# Patient Record
Sex: Male | Born: 1965
Health system: Southern US, Community
[De-identification: ages and names within clinical notes are randomized; demographics above are authoritative.]

## PROBLEM LIST (undated history)

## (undated) DIAGNOSIS — G4733 Obstructive sleep apnea (adult) (pediatric): Secondary | ICD-10-CM

## (undated) DIAGNOSIS — E7800 Pure hypercholesterolemia, unspecified: Secondary | ICD-10-CM

## (undated) DIAGNOSIS — E669 Obesity, unspecified: Secondary | ICD-10-CM

## (undated) DIAGNOSIS — Z8639 Personal history of other endocrine, nutritional and metabolic disease: Secondary | ICD-10-CM

## (undated) DIAGNOSIS — F419 Anxiety disorder, unspecified: Secondary | ICD-10-CM

## (undated) DIAGNOSIS — E349 Endocrine disorder, unspecified: Secondary | ICD-10-CM

## (undated) DIAGNOSIS — Z8669 Personal history of other diseases of the nervous system and sense organs: Secondary | ICD-10-CM

## (undated) DIAGNOSIS — G510 Bell's palsy: Secondary | ICD-10-CM

## (undated) DIAGNOSIS — R51 Headache: Secondary | ICD-10-CM

## (undated) DIAGNOSIS — B009 Herpesviral infection, unspecified: Secondary | ICD-10-CM

## (undated) DIAGNOSIS — G2581 Restless legs syndrome: Secondary | ICD-10-CM

## (undated) HISTORY — DX: Anxiety disorder, unspecified: F41.9

## (undated) HISTORY — PX: OTHER SURGICAL HISTORY: SHX169

## (undated) HISTORY — DX: Personal history of other endocrine, nutritional and metabolic disease: Z86.39

## (undated) HISTORY — DX: Bell's palsy: G51.0

## (undated) HISTORY — DX: Headache: R51

## (undated) HISTORY — DX: Obstructive sleep apnea (adult) (pediatric): G47.33

## (undated) HISTORY — DX: Pure hypercholesterolemia, unspecified: E78.00

## (undated) HISTORY — DX: Herpesviral infection, unspecified: B00.9

## (undated) HISTORY — DX: Personal history of other diseases of the nervous system and sense organs: Z86.69

## (undated) HISTORY — DX: Restless legs syndrome: G25.81

## (undated) HISTORY — DX: Obesity, unspecified: E66.9

## (undated) HISTORY — DX: Endocrine disorder, unspecified: E34.9

---

## 2004-02-03 ENCOUNTER — Ambulatory Visit: Payer: Self-pay | Admitting: Pulmonary Disease

## 2004-02-08 ENCOUNTER — Emergency Department (HOSPITAL_COMMUNITY): Admission: EM | Admit: 2004-02-08 | Discharge: 2004-02-08 | Payer: Self-pay | Admitting: Emergency Medicine

## 2004-03-04 ENCOUNTER — Ambulatory Visit: Payer: Self-pay | Admitting: Pulmonary Disease

## 2004-07-01 ENCOUNTER — Ambulatory Visit: Payer: Self-pay | Admitting: Pulmonary Disease

## 2004-07-20 ENCOUNTER — Ambulatory Visit: Payer: Self-pay

## 2004-08-19 ENCOUNTER — Ambulatory Visit (HOSPITAL_BASED_OUTPATIENT_CLINIC_OR_DEPARTMENT_OTHER): Admission: RE | Admit: 2004-08-19 | Discharge: 2004-08-19 | Payer: Self-pay | Admitting: Pulmonary Disease

## 2004-08-31 ENCOUNTER — Ambulatory Visit: Payer: Self-pay | Admitting: Pulmonary Disease

## 2005-02-10 ENCOUNTER — Ambulatory Visit: Payer: Self-pay | Admitting: Pulmonary Disease

## 2005-02-25 ENCOUNTER — Ambulatory Visit: Payer: Self-pay | Admitting: Pulmonary Disease

## 2005-03-01 ENCOUNTER — Ambulatory Visit: Payer: Self-pay | Admitting: Pulmonary Disease

## 2006-03-02 ENCOUNTER — Ambulatory Visit: Payer: Self-pay | Admitting: Pulmonary Disease

## 2006-03-02 LAB — CONVERTED CEMR LAB
Basophils Absolute: 0 10*3/uL (ref 0.0–0.1)
Basophils Relative: 0.6 % (ref 0.0–1.0)
Bilirubin, Direct: 0.1 mg/dL (ref 0.0–0.3)
Chloride: 106 meq/L (ref 96–112)
Creatinine, Ser: 0.8 mg/dL (ref 0.4–1.5)
Direct LDL: 157.8 mg/dL
GFR calc Af Amer: 139 mL/min
GFR calc non Af Amer: 115 mL/min
Glucose, Bld: 101 mg/dL — ABNORMAL HIGH (ref 70–99)
HCT: 39.3 % (ref 39.0–52.0)
Hemoglobin: 13.4 g/dL (ref 13.0–17.0)
MCHC: 34.1 g/dL (ref 30.0–36.0)
Monocytes Absolute: 0.5 10*3/uL (ref 0.2–0.7)
Neutrophils Relative %: 61.9 % (ref 43.0–77.0)
Potassium: 4 meq/L (ref 3.5–5.1)
RBC: 4.39 M/uL (ref 4.22–5.81)
RDW: 12.8 % (ref 11.5–14.6)
Sodium: 140 meq/L (ref 135–145)
TSH: 1.05 microintl units/mL (ref 0.35–5.50)
Total CHOL/HDL Ratio: 5.5
Triglycerides: 58 mg/dL (ref 0–149)
VLDL: 12 mg/dL (ref 0–40)
WBC: 8.2 10*3/uL (ref 4.5–10.5)

## 2006-03-14 ENCOUNTER — Ambulatory Visit: Payer: Self-pay | Admitting: Pulmonary Disease

## 2006-03-14 LAB — CONVERTED CEMR LAB
Fecal Occult Blood: NEGATIVE
OCCULT 2: NEGATIVE
OCCULT 5: NEGATIVE

## 2006-05-17 ENCOUNTER — Ambulatory Visit: Payer: Self-pay | Admitting: Pulmonary Disease

## 2006-05-17 ENCOUNTER — Ambulatory Visit: Payer: Self-pay | Admitting: Cardiology

## 2006-05-20 ENCOUNTER — Ambulatory Visit: Payer: Self-pay | Admitting: Pulmonary Disease

## 2006-08-17 ENCOUNTER — Ambulatory Visit: Payer: Self-pay | Admitting: Pulmonary Disease

## 2006-09-02 ENCOUNTER — Ambulatory Visit: Payer: Self-pay | Admitting: Pulmonary Disease

## 2006-09-07 ENCOUNTER — Ambulatory Visit: Payer: Self-pay | Admitting: Pulmonary Disease

## 2006-09-23 ENCOUNTER — Ambulatory Visit: Payer: Self-pay | Admitting: Pulmonary Disease

## 2007-04-07 ENCOUNTER — Telehealth (INDEPENDENT_AMBULATORY_CARE_PROVIDER_SITE_OTHER): Payer: Self-pay | Admitting: *Deleted

## 2007-05-24 DIAGNOSIS — R51 Headache: Secondary | ICD-10-CM | POA: Insufficient documentation

## 2007-05-24 DIAGNOSIS — R519 Headache, unspecified: Secondary | ICD-10-CM | POA: Insufficient documentation

## 2007-05-24 DIAGNOSIS — Z9189 Other specified personal risk factors, not elsewhere classified: Secondary | ICD-10-CM | POA: Insufficient documentation

## 2007-05-24 DIAGNOSIS — E7800 Pure hypercholesterolemia, unspecified: Secondary | ICD-10-CM | POA: Insufficient documentation

## 2007-05-24 DIAGNOSIS — G2581 Restless legs syndrome: Secondary | ICD-10-CM | POA: Insufficient documentation

## 2007-05-24 DIAGNOSIS — F411 Generalized anxiety disorder: Secondary | ICD-10-CM | POA: Insufficient documentation

## 2007-05-24 DIAGNOSIS — K5900 Constipation, unspecified: Secondary | ICD-10-CM | POA: Insufficient documentation

## 2007-05-25 ENCOUNTER — Ambulatory Visit: Payer: Self-pay | Admitting: Pulmonary Disease

## 2007-05-25 DIAGNOSIS — G510 Bell's palsy: Secondary | ICD-10-CM | POA: Insufficient documentation

## 2007-05-25 DIAGNOSIS — B009 Herpesviral infection, unspecified: Secondary | ICD-10-CM | POA: Insufficient documentation

## 2007-05-25 DIAGNOSIS — G4733 Obstructive sleep apnea (adult) (pediatric): Secondary | ICD-10-CM | POA: Insufficient documentation

## 2007-05-25 DIAGNOSIS — J40 Bronchitis, not specified as acute or chronic: Secondary | ICD-10-CM | POA: Insufficient documentation

## 2007-05-25 HISTORY — DX: Bell's palsy: G51.0

## 2007-05-26 ENCOUNTER — Telehealth (INDEPENDENT_AMBULATORY_CARE_PROVIDER_SITE_OTHER): Payer: Self-pay | Admitting: *Deleted

## 2007-05-28 LAB — CONVERTED CEMR LAB
ALT: 15 units/L (ref 0–53)
AST: 25 units/L (ref 0–37)
Albumin: 4.4 g/dL (ref 3.5–5.2)
Alkaline Phosphatase: 55 units/L (ref 39–117)
BUN: 6 mg/dL (ref 6–23)
Basophils Absolute: 0 10*3/uL (ref 0.0–0.1)
Basophils Relative: 0.2 % (ref 0.0–1.0)
Bilirubin, Direct: 0.1 mg/dL (ref 0.0–0.3)
CO2: 28 meq/L (ref 19–32)
Calcium: 9.3 mg/dL (ref 8.4–10.5)
Chloride: 106 meq/L (ref 96–112)
Cholesterol: 141 mg/dL (ref 0–200)
Creatinine, Ser: 0.9 mg/dL (ref 0.4–1.5)
Eosinophils Absolute: 0.2 10*3/uL (ref 0.0–0.7)
Eosinophils Relative: 2.6 % (ref 0.0–5.0)
GFR calc Af Amer: 120 mL/min
GFR calc non Af Amer: 99 mL/min
Glucose, Bld: 106 mg/dL — ABNORMAL HIGH (ref 70–99)
HCT: 40.8 % (ref 39.0–52.0)
HDL: 33.4 mg/dL — ABNORMAL LOW (ref >39.0)
Hemoglobin: 13.7 g/dL (ref 13.0–17.0)
LDL Cholesterol: 101 mg/dL — ABNORMAL HIGH (ref 0–99)
Lymphocytes Relative: 24.8 % (ref 12.0–46.0)
MCHC: 33.5 g/dL (ref 30.0–36.0)
MCV: 90.2 fL (ref 78.0–100.0)
Monocytes Absolute: 0.3 10*3/uL (ref 0.1–1.0)
Monocytes Relative: 4.1 % (ref 3.0–12.0)
Neutro Abs: 5.4 10*3/uL (ref 1.4–7.7)
Neutrophils Relative %: 68.3 % (ref 43.0–77.0)
Platelets: 312 10*3/uL (ref 150–400)
Potassium: 4.4 meq/L (ref 3.5–5.1)
RBC: 4.52 M/uL (ref 4.22–5.81)
RDW: 13 % (ref 11.5–14.6)
Sodium: 138 meq/L (ref 135–145)
TSH: 0.83 microintl units/mL (ref 0.35–5.50)
Testosterone: 309.17 ng/dL — ABNORMAL LOW (ref 350.00–890)
Total Bilirubin: 0.7 mg/dL (ref 0.3–1.2)
Total CHOL/HDL Ratio: 4.2
Total Protein: 7 g/dL (ref 6.0–8.3)
Triglycerides: 34 mg/dL (ref 0–149)
VLDL: 7 mg/dL (ref 0–40)
WBC: 7.9 10*3/uL (ref 4.5–10.5)

## 2007-06-30 ENCOUNTER — Telehealth (INDEPENDENT_AMBULATORY_CARE_PROVIDER_SITE_OTHER): Payer: Self-pay | Admitting: *Deleted

## 2007-08-28 ENCOUNTER — Telehealth (INDEPENDENT_AMBULATORY_CARE_PROVIDER_SITE_OTHER): Payer: Self-pay | Admitting: *Deleted

## 2007-09-19 ENCOUNTER — Telehealth (INDEPENDENT_AMBULATORY_CARE_PROVIDER_SITE_OTHER): Payer: Self-pay | Admitting: *Deleted

## 2008-01-11 ENCOUNTER — Telehealth (INDEPENDENT_AMBULATORY_CARE_PROVIDER_SITE_OTHER): Payer: Self-pay | Admitting: *Deleted

## 2008-01-29 ENCOUNTER — Ambulatory Visit: Payer: Self-pay | Admitting: Pulmonary Disease

## 2008-01-29 DIAGNOSIS — K649 Unspecified hemorrhoids: Secondary | ICD-10-CM | POA: Insufficient documentation

## 2008-01-29 DIAGNOSIS — E291 Testicular hypofunction: Secondary | ICD-10-CM | POA: Insufficient documentation

## 2008-01-31 ENCOUNTER — Ambulatory Visit: Payer: Self-pay | Admitting: Gastroenterology

## 2008-01-31 DIAGNOSIS — K625 Hemorrhage of anus and rectum: Secondary | ICD-10-CM | POA: Insufficient documentation

## 2008-02-13 ENCOUNTER — Ambulatory Visit: Payer: Self-pay | Admitting: Pulmonary Disease

## 2008-02-13 ENCOUNTER — Telehealth: Payer: Self-pay | Admitting: Pulmonary Disease

## 2008-02-21 ENCOUNTER — Telehealth: Payer: Self-pay | Admitting: Pulmonary Disease

## 2008-02-21 LAB — CONVERTED CEMR LAB
Albumin: 4.5 g/dL (ref 3.5–5.2)
Alkaline Phosphatase: 57 units/L (ref 39–117)
BUN: 10 mg/dL (ref 6–23)
Cholesterol: 115 mg/dL (ref 0–200)
Eosinophils Absolute: 0.1 10*3/uL (ref 0.0–0.7)
GFR calc Af Amer: 120 mL/min
GFR calc non Af Amer: 99 mL/min
HDL: 39.9 mg/dL (ref >39.0)
MCHC: 33.4 g/dL (ref 30.0–36.0)
MCV: 91.3 fL (ref 78.0–100.0)
Neutrophils Relative %: 93.5 % — ABNORMAL HIGH (ref 43.0–77.0)
Platelets: 241 10*3/uL (ref 150–400)
Potassium: 4.3 meq/L (ref 3.5–5.1)
RDW: 12.4 % (ref 11.5–14.6)
Total Bilirubin: 1 mg/dL (ref 0.3–1.2)
Transferrin: 261.2 mg/dL (ref >=212.0)
VLDL: 6 mg/dL (ref 0–40)
Vit D, 1,25-Dihydroxy: 16 — ABNORMAL LOW (ref 30–89)

## 2008-02-26 ENCOUNTER — Telehealth: Payer: Self-pay | Admitting: Gastroenterology

## 2008-03-18 ENCOUNTER — Telehealth (INDEPENDENT_AMBULATORY_CARE_PROVIDER_SITE_OTHER): Payer: Self-pay | Admitting: *Deleted

## 2008-03-19 ENCOUNTER — Telehealth (INDEPENDENT_AMBULATORY_CARE_PROVIDER_SITE_OTHER): Payer: Self-pay | Admitting: *Deleted

## 2008-03-27 ENCOUNTER — Ambulatory Visit: Payer: Self-pay | Admitting: Gastroenterology

## 2008-04-03 ENCOUNTER — Encounter: Payer: Self-pay | Admitting: Gastroenterology

## 2008-04-03 ENCOUNTER — Ambulatory Visit: Payer: Self-pay | Admitting: Gastroenterology

## 2008-04-09 ENCOUNTER — Encounter: Payer: Self-pay | Admitting: Gastroenterology

## 2008-05-01 ENCOUNTER — Telehealth (INDEPENDENT_AMBULATORY_CARE_PROVIDER_SITE_OTHER): Payer: Self-pay | Admitting: *Deleted

## 2008-06-04 ENCOUNTER — Telehealth (INDEPENDENT_AMBULATORY_CARE_PROVIDER_SITE_OTHER): Payer: Self-pay | Admitting: *Deleted

## 2008-07-08 ENCOUNTER — Telehealth: Payer: Self-pay | Admitting: Pulmonary Disease

## 2008-10-18 ENCOUNTER — Telehealth: Payer: Self-pay | Admitting: Pulmonary Disease

## 2008-11-07 ENCOUNTER — Telehealth (INDEPENDENT_AMBULATORY_CARE_PROVIDER_SITE_OTHER): Payer: Self-pay | Admitting: *Deleted

## 2009-02-20 ENCOUNTER — Ambulatory Visit: Payer: Self-pay | Admitting: Pulmonary Disease

## 2009-02-20 ENCOUNTER — Telehealth (INDEPENDENT_AMBULATORY_CARE_PROVIDER_SITE_OTHER): Payer: Self-pay | Admitting: *Deleted

## 2009-02-20 ENCOUNTER — Ambulatory Visit: Payer: Self-pay | Admitting: Internal Medicine

## 2009-02-20 DIAGNOSIS — R319 Hematuria, unspecified: Secondary | ICD-10-CM | POA: Insufficient documentation

## 2009-02-21 DIAGNOSIS — E559 Vitamin D deficiency, unspecified: Secondary | ICD-10-CM | POA: Insufficient documentation

## 2009-02-21 DIAGNOSIS — D509 Iron deficiency anemia, unspecified: Secondary | ICD-10-CM | POA: Insufficient documentation

## 2009-02-21 LAB — CONVERTED CEMR LAB
AST: 24 units/L (ref 0–37)
Albumin: 4.3 g/dL (ref 3.5–5.2)
Alkaline Phosphatase: 53 units/L (ref 39–117)
BUN: 7 mg/dL (ref 6–23)
Basophils Relative: 0.8 % (ref 0.0–3.0)
CO2: 27 meq/L (ref 19–32)
Cholesterol: 122 mg/dL (ref 0–200)
Glucose, Bld: 90 mg/dL (ref 70–99)
HDL: 47.4 mg/dL (ref >39.00)
Ketones, ur: NEGATIVE mg/dL
LDL Cholesterol: 68 mg/dL (ref 0–99)
Lymphocytes Relative: 28.7 % (ref 12.0–46.0)
Monocytes Relative: 3.8 % (ref 3.0–12.0)
Neutrophils Relative %: 63.8 % (ref 43.0–77.0)
Platelets: 184 10*3/uL (ref 150.0–400.0)
RDW: 12.8 % (ref 11.5–14.6)
Specific Gravity, Urine: 1.01 (ref 1.000–1.030)
TSH: 0.81 microintl units/mL (ref 0.35–5.50)
Total CHOL/HDL Ratio: 3
Total Protein, Urine: NEGATIVE mg/dL
Total Protein: 7.4 g/dL (ref 6.0–8.3)
Triglycerides: 32 mg/dL (ref 0.0–149.0)
Urine Glucose: NEGATIVE mg/dL
Urobilinogen, UA: 0.2 (ref 0.0–1.0)
VLDL: 6.4 mg/dL (ref 0.0–40.0)
pH: 7.5 (ref 5.0–8.0)

## 2009-02-24 ENCOUNTER — Telehealth (INDEPENDENT_AMBULATORY_CARE_PROVIDER_SITE_OTHER): Payer: Self-pay | Admitting: *Deleted

## 2009-03-26 ENCOUNTER — Telehealth (INDEPENDENT_AMBULATORY_CARE_PROVIDER_SITE_OTHER): Payer: Self-pay | Admitting: *Deleted

## 2010-02-26 NOTE — Progress Notes (Signed)
Summary: z pak / cough syrup  Phone Note Call from Patient Call back at [phone]   Caller: Patient Call For: nadel Summary of Call: pt need z pak and cough syrup [facility] [facility] Initial call taken by: Rickard Patience,  March 26, 2009 2:34 PM  Follow-up for Phone Call        called spoke with patient, he states that he has a sore throat and dry cough onset yesterday.  pt denies wheezing/dyspnea, chest/head congestion and f/c/s.  pt is requesting a zpak and tussionex.  please advise, thanks!  ALLERGIES: codeine  Boone Master CNA  March 26, 2009 3:19 PM   Additional Follow-up for Phone Call Additional follow up Details #1::        per sn ok for z-pak #1 as directed, phenergan expectorant with codiene #6oz 1 tsp every 6 hrs as needed cough no refills Additional Follow-up by: Philipp Deputy CMA,  March 26, 2009 4:52 PM    Additional Follow-up for Phone Call Additional follow up Details #2::    Called and spoke with pt. and advised of the above recs per SN.  Pt states that he can not take codiene (causes vomiting), so advised that he try taking delsym for cough.  Z-pack was sent electronically to pharm. Follow-up by: Vernie Murders,  March 26, 2009 5:01 PM  New/Updated Medications: ZITHROMAX Z-PAK 250 MG TABS (AZITHROMYCIN) take as directed Prescriptions: ZITHROMAX Z-PAK 250 MG TABS (AZITHROMYCIN) take as directed  #1 x 0   Entered by:   Vernie Murders   Authorized by:   Michele Mcalpine MD   Signed by:   Vernie Murders on 03/26/2009   Method used:   Electronically to        [facility] # [phone]* (retail)       [address] [place], [place]  [address]       Ph: [phone]       Fax: [phone]   RxID:   [id number]

## 2010-02-26 NOTE — Progress Notes (Signed)
Summary: results  Phone Note Call from Patient Call back at [phone]   Summary of Call: calling for results Initial call taken by: Rickard Patience,  February 24, 2009 8:22 AM  Follow-up for Phone Call        called, spoke with pt.  Pt informed of CT results as stated in append on 1/28 by SN and aware to finish doxy and to call office back if sxs continue.  He verbalized understanding.   Follow-up by: Gweneth Dimitri RN,  February 24, 2009 9:19 AM

## 2010-02-26 NOTE — Progress Notes (Signed)
Summary: bleeding  Phone Note Call from Patient Call back at [phone]   Caller: Patient Call For: nadel Summary of Call: pt bleeding from penis area would like to see dr Kriste Basque taday Initial call taken by: Rickard Patience,  February 20, 2009 8:18 AM  Follow-up for Phone Call        The patient states he has had a bloody discharge from his penis with urination and without for 2 days. He denies any pain or rash. The patient will see SN today @ 10:30am. Follow-up by: Michel Bickers CMA,  February 20, 2009 8:51 AM

## 2010-02-26 NOTE — Assessment & Plan Note (Signed)
Summary: saw blood in urine/LC   Primary Care Provider:  Alroy Dust, MD  CC:  1 year ROV & add-on for blood in urine....  History of Present Illness: 45 y/o BM here for a f/u visit... he has several med problems as listed below...    ~  January 29, 2008:  he went to the [facility] w/ left flank pain and eval revealed only constipation "blockage in my bowels" resolved w/ Miralax daily... we don't have records but he describes XRays, lab, urine... he also saw some blood in stool w/ straining and there is a +FamHx of colon polyps in father w/ large polyp that is pending surgery in [place]... we will Rx w/ Miralax, Anusol HC cream, and refer to GI for Flex vs Colonoscopy >> s/p colon 3/10 DrJacobs- neg.   ~  February 20, 2009:  saw blood in urine yest (1st time ever), at end of stream, no pain or dysuria... known atrophic testes & low testosterone w/o symptoms, no hx kidney stones etc... we discussed checking labs, urine, CT Abd... otherw he's had a good yr- no other complaints or concerns...    Current Problem List:  Hx of BRONCHITIS (ICD-490) - no recent symptoms, doing well.  OBSTRUCTIVE SLEEP APNEA (ICD-327.23) - sleep study 7/06 w/ RDI 17, desat to 93%, mod snoring, no arrhythmias... he had signif leg jerks, therefore Requip given...  CHEST WALL PAIN, HX OF (ICD-V15.89)  HYPERCHOLESTEROLEMIA (ICD-272.0) - on SIMVASTATIN 40mg /d...  ~  FLP 2/08 showed TChol 201, TG 58, HDL 36, LDL 158... Simvastatin 40mg /d started then.  ~  FLP 4/09 showed TChol 141, TG 34, HDL 33, LDL 101... rec- same med, better diet.  ~  FLP 1/10 showed TChol 115, TG 29, HDL 40, LDL 69  ~  FLP 1/11 showed TChol 122, TG 32, HDL 47, LDL 68  OBESITY (ICD-278.00) -   ~  weight 1/10 = 249#, up 9# since last OV.Marland Kitchen.  ~  weight 1/11 = 246#  CONSTIPATION (ICD-564.00) - on Miralax + Anusol HC Prn... Hx blood in stool & referred to GI w/ eval DrJacobs 3/10- colonoscopy showed hemorroid + 1 sm polyp= lymphoid aggregate, f/u  15yrs.  TESTOSTERONE DEFICIENCY (ICD-257.2) - he has testic atrophy on exam, but remains asymptomatic w/ good energy, drive, performance, etc...   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  HEADACHE (ICD-784.0) - prev Rx w/ Topomax 100mg /d and Flexeril 10mg Prn per DrFreeman  Hx of BELLS PALSY (ICD-351.0)  RESTLESS LEG SYNDROME (ICD-333.94) - not currently on Rx- prev Requip, denies restless legs etc...  ANXIETY (ICD-300.00)  VITAMIN D DEFICIENCY (ICD-268.9) - on Vit D OTC 1000 u daily...  ~  labs 1/10 showed Vit D level = 16... rec> OTC Vit D 01-1998 daily.  ~  labs 1/11 showed Vit D level =   Hx of IRON DEFICIENCY (ICD-280.9) - on OTC Fe supplement daily...  ~  labs 1/10 showed Hg= 14.6, MCV= 91, Fe= 37 (sat=10%)  ~  labs 1/11 showed Hg= 13.5, MCV= 93, Fe= 85 (sat=26%)    Allergies: 1)  Codeine Phosphate (Codeine Phosphate)  Comments:  Nurse/Medical Assistant: The patient's medications and allergies were reviewed with the patient and were updated in the Medication and Allergy Lists.  Past History:  Past Medical History:  Hx of BRONCHITIS (ICD-490) OBSTRUCTIVE SLEEP  APNEA (ICD-327.23) CHEST WALL PAIN, HX OF (ICD-V15.89) HYPERCHOLESTEROLEMIA (ICD-272.0) OBESITY (ICD-278.00) HEMORRHOIDS (ICD-455.6) CONSTIPATION (ICD-564.00) TESTOSTERONE DEFICIENCY (ICD-257.2) Hx of HSV (ICD-054.9) HEADACHE (ICD-784.0) Hx of BELLS PALSY (ICD-351.0) RESTLESS LEG SYNDROME (ICD-333.94) ANXIETY (ICD-300.00) VITAMIN D DEFICIENCY (ICD-268.9) Hx of IRON DEFICIENCY (ICD-280.9)  Past Surgical History: S/P right inguinal hernia repair as a child  Family History: Reviewed history from 01/31/2008 and no changes required. Father alive age 10 w/ colon polyps and surgery pending for large polyp... Mother, Binh Doten, alive age 53 w/ HBP, DM, CAD,  etc... 2 Siblings:  1 Bro & 1 Sis in good general health   Social History: Reviewed history from 01/31/2008 and no changes required. married, no children, does not drink alcohol, does not smoke cigarettes, drinks one to 2 caffeinated beverages a day.  Review of Systems      See HPI  The patient denies anorexia, fever, weight loss, weight gain, vision loss, decreased hearing, hoarseness, chest pain, syncope, dyspnea on exertion, peripheral edema, prolonged cough, headaches, hemoptysis, abdominal pain, melena, hematochezia, severe indigestion/heartburn, hematuria, incontinence, muscle weakness, suspicious skin lesions, transient blindness, difficulty walking, depression, unusual weight change, abnormal bleeding, enlarged lymph nodes, and angioedema.    Vital Signs:  Patient profile:   45 year old male Height:      71 inches Weight:      246 pounds BMI:     34.43 O2 Sat:      99 % on Room air Temp:     97.5 degrees F oral Pulse rate:   73 / minute BP sitting:   112 / 76  (left arm) Cuff size:   regular  Vitals Entered By: Randell Loop CMA (February 20, 2009 10:42 AM)  O2 Sat at Rest %:  99 O2 Flow:  Room air CC: 1 year ROV & add-on for blood in urine... Is Patient Diabetic? No Pain Assessment Patient in pain? no      Comments meds updated today   Physical Exam  Additional Exam:  WD, Overweight, 45 y/o BM in NAD... GENERAL:  Alert & oriented; pleasant & cooperative... HEENT:  [place]/AT, EOM-wnl, PERRLA, EACs-clear, TMs-wnl, NOSE-clear, THROAT-clear & wnl. NECK:  Supple w/ full ROM; no JVD; normal carotid impulses w/o bruits; no thyromegaly or nodules palpated; no lymphadenopathy. CHEST:  Clear to P & A; without wheezes/ rales/ or rhonchi. HEART:  Regular Rhythm; without murmurs/ rubs/ or gallops. ABDOMEN:  Soft & nontender; normal bowel sounds; no organomegaly or masses detected. RECTAL:  Neg - prostate 2+ & min tender w/o nodules; testicles are atrophic; stool hematest  neg. EXT: without deformities or arthritic changes; no varicose veins/ venous insuffic/ or edema. NEURO:  CN's intact; motor testing normal; sensory testing normal; gait normal & balance OK. DERM:  No lesions noted; no rash etc...     CT of Abdomen  Procedure date:  02/20/2009  Findings:      CT ABDOMEN AND PELVIS WITHOUT CONTRAST   Findings: Lung bases are clear.  The base of the heart appears normal.   Non-IV contrast images demonstrate no focal hepatic lesion.  The gallbladder, pancreas, spleen, adrenal glands appear normal.   No evidence of nephrolithiasis or ureterolithiasis.  No evidence of obstructive uropathy.  No evidence of bladder stones.   The stomach, small bowel, appendix, and cecum appear normal.  There are several diverticula the descending colon without evidence of acute inflammation.   Abdominal aorta is normal caliber.  No evidence of retroperitoneal or pelvic lymphadenopathy.   The bladder and  prostate appear normal. There is immediate density fluid within the left inguinal canal leading down to the scrotum along the spermatic cord.  This may represent extension from a hydrocele below or potentially fluid from inguinal hernia from above.  No bony abnormality.   IMPRESSION:   1.  No evidence of nephrolithiasis, ureterolithiasis, or obstructive uropathy. 2.  Fluid  along the spermatic cord could  represent a extension from a hydrocele, varicocele, or inguinal hernia.  Consider scrotal ultrasound to exclude  scrotal or testicular pathology.   Read By:  Genevive Bi,  M.D.       MISC. Report  Procedure date:  02/20/2009  Findings:      Lipid Panel (LIPID)   Cholesterol               122 mg/dL                   8-119   Triglycerides             32.0 mg/dL                  1.4-782.9   HDL                       56.21 mg/dL                 >30.86   LDL Cholesterol           68 mg/dL                    5-78  BMP (METABOL)   Sodium                     139 mEq/L                   135-145   Potassium                 4.7 mEq/L                   3.5-5.1   Chloride                  105 mEq/L                   96-112   Carbon Dioxide            27 mEq/L                    19-32   Glucose                   90 mg/dL                    46-96   BUN                       7 mg/dL                     2-95   Creatinine                0.9 mg/dL                   2.8-4.1   Calcium                   9.6 mg/dL  8.4-10.5   GFR                       119.07 mL/min               >60  Hepatic/Liver Function Panel (HEPATIC)   Total Bilirubin           0.7 mg/dL                   0.4-5.4   Direct Bilirubin          0.3 mg/dL                   0.9-8.1   Alkaline Phosphatase      53 U/L                      39-117   AST                       24 U/L                      0-37   ALT                       17 U/L                      0-53   Total Protein             7.4 g/dL                    1.9-1.4   Albumin                   4.3 g/dL                    7.8-2.9  CBC Platelet w/Diff (CBCD)   White Cell Count          8.8 K/uL                    4.5-10.5   Red Cell Count            4.56 Mil/uL                 4.22-5.81   Hemoglobin                13.5 g/dL                   56.2-13.0   Hematocrit                42.5 %                      39.0-52.0   MCV                       93.1 fl                     78.0-100.0   Platelet Count            184.0 K/uL                  150.0-400.0   Neutrophil %              63.8 %  43.0-77.0  Lymphocyte %              28.7 %                      12.0-46.0   Monocyte %                3.8 %                       3.0-12.0  Comments:      TSH (TSH)   FastTSH                   0.81 uIU/mL                 0.35-5.50  IBC Panel (IBC)   Iron                      85 ug/dL                    11-914   Transferrin               228.6 mg/dL                 782.9-562.1   Iron Saturation            26.6 %                      20.0-50.0  Testosterone, Total (TESTO)   Testosterone         [L]  298.52 ng/dL                308.65-784.69  UDip w/Micro (URINE)   Color                     LT. YELLOW   Clarity                   CLEAR                       Clear   Specific Gravity          1.010                       1.000 - 1.030   Urine Ph                  7.5                         5.0-8.0   Protein                   NEGATIVE                    Negative   Urine Glucose             NEGATIVE                    Negative   Ketones                   NEGATIVE                    Negative   Urine Bilirubin           NEGATIVE  Negative   Blood                     NEGATIVE                    Negative   Urobilinogen              0.2                         0.0 - 1.0   Leukocyte Esterace        NEGATIVE                    Negative   Nitrite                   NEGATIVE                    Negative   Urine Epith               Rare(0-4/hpf)               Rare(0-4/hpf)   Impression & Recommendations:  Problem # 1:  HEMATURIA UNSPECIFIED (ICD-599.70) No blood in urine today on UA, exam unchanged, CT Abd w/o lesions or stones... Discussed Rx w/ Doxy... observation & Urology referral if recurrent symptoms... His updated medication list for this problem includes:    Doxycycline Hyclate 100 Mg Caps (Doxycycline hyclate) .Marland Kitchen... Take 1 cap by mouth two times a day til gone...  Orders: TLB-Lipid Panel (80061-LIPID) TLB-BMP (Basic Metabolic Panel-BMET) (80048-METABOL) TLB-Hepatic/Liver Function Pnl (80076-HEPATIC) TLB-CBC Platelet - w/Differential (85025-CBCD) TLB-TSH (Thyroid Stimulating Hormone) (84443-TSH) TLB-IBC Pnl (Iron/FE;Transferrin) (83550-IBC) TLB-Testosterone, Total (84403-TESTO) TLB-Udip w/ Micro (81001-URINE) Radiology Referral (Radiology)  Problem # 2:  HYPERCHOLESTEROLEMIA (ICD-272.0) FLP looks good-  continue same meds... His updated medication list for this problem  includes:    Simvastatin 40 Mg Tabs (Simvastatin) .Marland Kitchen... Take 1 tab by mouth at bedtime...  Problem # 3:  OBESITY (ICD-278.00) Discussed diet + exercise, get weight down...  Problem # 4:  TESTOSTERONE DEFICIENCY (ICD-257.2) He remains asymptomatic & not on Rx...  Problem # 5:  ANXIETY (ICD-300.00) Aware-  he does not want anxiolytic Rx...  Problem # 6:  OTHER MEDICAL PROBLEMS AS NOTED>>> Continue Vit D 1000 u daily...  Complete Medication List: 1)  Simvastatin 40 Mg Tabs (Simvastatin) .... Take 1 tab by mouth at bedtime.Marland KitchenMarland Kitchen 2)  Miralax Powd (Polyethylene glycol 3350) .... Mix 1 capful in water daily.Marland KitchenMarland Kitchen 3)  Anusol-hc 2.5 % Crea (Hydrocortisone) .... Apply as directed two times a day... 4)  Acyclovir 200 Mg Caps (Acyclovir) .Marland Kitchen.. 1 capsule by mouth twice a day 5)  Zovirax 5 % Crea (Acyclovir) .... Apply as directed, as needed... 6)  Lotrisone 1-0.05 % Crea (Clotrimazole-betamethasone) .... Apply as needed for rash... 7)  Doxycycline Hyclate 100 Mg Caps (Doxycycline hyclate) .... Take 1 cap by mouth two times a day til gone...  Other Orders: Prescription Created Electronically [id number])  Patient Instructions: 1)  Today we updated your med list- see below....  2)  We refilled your meds for 2011 & wrote a new perscription for DOXYCYCLINE to take twice daily x 10d... 3)  Drink plenty of water... 4)  Today we did your follow up lab work>>> 5)  We will arrange for a CT scan of your abd, and call you w/ these results when avail.Marland KitchenMarland Kitchen 6)  Call for any questions... Prescriptions: DOXYCYCLINE HYCLATE  100 MG CAPS (DOXYCYCLINE HYCLATE) take 1 cap by mouth two times a day til gone...  #20 x 0   Entered and Authorized by:   Michele Mcalpine MD   Signed by:   Michele Mcalpine MD on 02/20/2009   Method used:   Print then Give to Patient   RxID:   [id number] ACYCLOVIR 200 MG CAPS (ACYCLOVIR) 1 capsule by mouth twice a day  #60 x prn   Entered and Authorized by:   Michele Mcalpine MD   Signed by:   Michele Mcalpine MD on 02/20/2009   Method used:   Print then Give to Patient   RxID:   [id number] SIMVASTATIN 40 MG  TABS (SIMVASTATIN) take 1 tab by mouth at bedtime...  #30 x prn   Entered and Authorized by:   Michele Mcalpine MD   Signed by:   Michele Mcalpine MD on 02/20/2009   Method used:   Print then Give to Patient   RxID:   [id number]

## 2010-03-04 ENCOUNTER — Other Ambulatory Visit: Payer: PRIVATE HEALTH INSURANCE

## 2010-03-04 ENCOUNTER — Encounter: Payer: Self-pay | Admitting: Pulmonary Disease

## 2010-03-04 ENCOUNTER — Other Ambulatory Visit: Payer: Self-pay | Admitting: Pulmonary Disease

## 2010-03-04 ENCOUNTER — Encounter (INDEPENDENT_AMBULATORY_CARE_PROVIDER_SITE_OTHER): Payer: PRIVATE HEALTH INSURANCE | Admitting: Pulmonary Disease

## 2010-03-04 ENCOUNTER — Ambulatory Visit (INDEPENDENT_AMBULATORY_CARE_PROVIDER_SITE_OTHER)
Admission: RE | Admit: 2010-03-04 | Discharge: 2010-03-04 | Disposition: A | Discharge status: Home / Self Care | Payer: PRIVATE HEALTH INSURANCE | Source: Ambulatory Visit | Attending: Pulmonary Disease | Admitting: Pulmonary Disease

## 2010-03-04 DIAGNOSIS — Z0000 Encounter for general adult medical examination without abnormal findings: Secondary | ICD-10-CM

## 2010-03-04 LAB — BASIC METABOLIC PANEL
BUN: 11 mg/dL (ref 6–23)
CO2: 28 mEq/L (ref 19–32)
Calcium: 9.6 mg/dL (ref 8.4–10.5)
Chloride: 102 mEq/L (ref 96–112)
Glucose, Bld: 85 mg/dL (ref 70–99)
Potassium: 4.5 mEq/L (ref 3.5–5.1)

## 2010-03-04 LAB — HEPATIC FUNCTION PANEL
ALT: 16 U/L (ref 0–53)
AST: 22 U/L (ref 0–37)
Total Bilirubin: 0.6 mg/dL (ref 0.3–1.2)
Total Protein: 7.5 g/dL (ref 6.0–8.3)

## 2010-03-04 LAB — CBC WITH DIFFERENTIAL/PLATELET
Eosinophils Absolute: 0.2 10*3/uL (ref 0.0–0.7)
MCHC: 33.9 g/dL (ref 30.0–36.0)
MCV: 90.4 fl (ref 78.0–100.0)
Monocytes Absolute: 0.5 10*3/uL (ref 0.1–1.0)
Neutrophils Relative %: 64.1 % (ref 43.0–77.0)
Platelets: 309 10*3/uL (ref 150.0–400.0)
WBC: 8.5 10*3/uL (ref 4.5–10.5)

## 2010-03-04 LAB — LIPID PANEL
Cholesterol: 131 mg/dL (ref 0–200)
Triglycerides: 32 mg/dL (ref 0.0–149.0)

## 2010-03-08 LAB — CONVERTED CEMR LAB: Vit D, 25-Hydroxy: 30 ng/mL (ref 30–89)

## 2010-03-18 NOTE — Assessment & Plan Note (Signed)
Summary: cpx//sh   Primary Care Provider:  Alroy Dust, MD  CC:  Yearly ROV & CPX....  History of Present Illness: 45 y/o BM here for a f/u visit... he has several med problems as listed below...    ~  January 29, 2008:  he went to the [facility] w/ left flank pain and eval revealed only constipation "blockage in my bowels" resolved w/ Miralax daily... we don't have records but he describes XRays, lab, urine... he also saw some blood in stool w/ straining and there is a +FamHx of colon polyps in father w/ large polyp that is pending surgery in [place]... we will Rx w/ Miralax, Anusol HC cream, and refer to GI for Flex vs Colonoscopy ==> s/p colon 3/10 DrJacobs- neg.   ~  February 20, 2009:  saw blood in urine yest (1st time ever), at end of stream, no pain or dysuria... known atrophic testes & low testosterone w/o symptoms, no hx kidney stones etc... we discussed checking labs (nl x low-T), urine (clear), CT Abd (neg-NAD)... otherw he's had a good yr- no other complaints or concerns...   ~  March 04, 2010:  here for yearly check up> feeling well, good energy, no new complaints or concerns...  he continues to have some sleep issues- nocturia x2, wakes tired, sleepy in eve w/TV, but refuses repeat sleep study;  Chol well controlled on Simva40, needs better diet & wt reduction;  still declines Testos replacement Rx or Urology consult;  Labs reviewed> OK x low-T & VitD=30 (rec OTC 1000u daily)...    Current Problem List:  Hx of BRONCHITIS (ICD-490) - no recent symptoms, doing well.  OBSTRUCTIVE SLEEP APNEA (ICD-327.23) - sleep study 7/06 w/ RDI 17, desat to 93%, mod snoring, no arrhythmias... he had signif leg jerks, therefore Requip given, but he stopped & denies symptoms...  CHEST WALL PAIN, HX OF (ICD-V15.89)  HYPERCHOLESTEROLEMIA (ICD-272.0) - on SIMVASTATIN 40mg /d...  ~  FLP 2/08 showed TChol 201, TG 58, HDL 36, LDL 158... Simvastatin 40mg /d started then.  ~  FLP 4/09 showed TChol 141, TG  34, HDL 33, LDL 101... rec- same med, better diet.  ~  FLP 1/10 showed TChol 115, TG 29, HDL 40, LDL 69  ~  FLP 1/11 showed TChol 122, TG 32, HDL 47, LDL 68  ~  FLP 2/12 on Simva40 showed TChol 131, TG 32, HDL 44, LDL 81  OBESITY (ICD-278.00) -   ~  weight 1/10 = 249#, up 9# since last OV.Marland Kitchen.  ~  weight 1/11 = 246#  ~  weight 2/12 = 242#  CONSTIPATION (ICD-564.00) - on Miralax + Anusol HC Prn... Hx blood in stool & referred to GI w/ eval DrJacobs 3/10- colonoscopy showed hemorroid + 1 sm polyp= lymphoid aggregate, f/u 46yrs.  TESTOSTERONE DEFICIENCY (ICD-257.2) - he has testic atrophy on exam, but remains asymptomatic w/ good energy, drive, performance, etc & he declines replacement Rx or Urology consultation...  ~  labs 3/09 showed testos level = 309 (350-890)  ~  labs 1/11 showed testos level = 299  ~  labs 2/12 showed testos level = 230... he still declines replacement Rx.  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  HEADACHE (ICD-784.0) - prev Rx w/ Topomax 100mg /d and Flexeril 10mg Prn per DrFreeman  Hx of BELLS PALSY (ICD-351.0)  RESTLESS LEG SYNDROME (ICD-333.94) - not currently on Rx- prev Requip, denies restless legs etc...  ANXIETY (ICD-300.00)  VITAMIN  D DEFICIENCY (ICD-268.9) - on Vit D OTC 1000 u daily...  ~  labs 1/10 showed Vit D level = 16... rec> OTC Vit D 01-1998 daily.  ~  labs 2/12 showed Vit D level = 30... rec to continue 01-1998 u daily.  Hx of IRON DEFICIENCY (ICD-280.9) - on OTC Fe supplement daily...  ~  labs 1/10 showed Hg= 14.6, MCV= 91, Fe= 37 (sat=10%)  ~  labs 1/11 showed Hg= 13.5, MCV= 93, Fe= 85 (sat=26%)  ~  labs 2/12 showed Hg= 14.0, MCV= 90   Preventive Screening-Counseling & Management  Alcohol-Tobacco     Smoking Status: never  Allergies: 1)  Codeine Phosphate (Codeine Phosphate)  Comments:  Nurse/Medical Assistant: The patient's medications and allergies were reviewed with the patient and  were updated in the Medication and Allergy Lists.  Past History:  Past Medical History: Hx of BRONCHITIS (ICD-490) OBSTRUCTIVE SLEEP APNEA (ICD-327.23) CHEST WALL PAIN, HX OF (ICD-V15.89) HYPERCHOLESTEROLEMIA (ICD-272.0) OBESITY (ICD-278.00) HEMORRHOIDS (ICD-455.6) CONSTIPATION (ICD-564.00) TESTOSTERONE DEFICIENCY (ICD-257.2) Hx of HSV (ICD-054.9) HEADACHE (ICD-784.0) Hx of BELLS PALSY (ICD-351.0) RESTLESS LEG SYNDROME (ICD-333.94) ANXIETY (ICD-300.00) VITAMIN D DEFICIENCY (ICD-268.9) Hx of IRON DEFICIENCY (ICD-280.9)  Past Surgical History: S/P right inguinal hernia repair as a child  Family History: Reviewed history from 01/31/2008 and no changes required. Father alive age 62 w/ colon polyps and surgery pending for large polyp... Mother, Jakson Delpilar, alive age 39 w/ HBP, DM, CAD, etc... 2 Siblings:  1 Bro & 1 Sis in good general health   Social History: Reviewed history from 01/31/2008 and no changes required. married, no children, does not drink alcohol, does not smoke cigarettes, drinks one to 2 caffeinated beverages a day  Review of Systems  The patient denies fever, chills, sweats, anorexia, fatigue, weakness, malaise, weight loss, sleep disorder, blurring, diplopia, eye irritation, eye discharge, vision loss, eye pain, photophobia, earache, ear discharge, tinnitus, decreased hearing, nasal congestion, nosebleeds, sore throat, hoarseness, chest pain, palpitations, syncope, dyspnea on exertion, orthopnea, PND, peripheral edema, cough, dyspnea at rest, excessive sputum, hemoptysis, wheezing, pleurisy, nausea, vomiting, diarrhea, constipation, change in bowel habits, abdominal pain, melena, hematochezia, jaundice, gas/bloating, indigestion/heartburn, dysphagia, odynophagia, dysuria, hematuria, urinary frequency, urinary hesitancy, nocturia, incontinence, back pain, joint pain, joint swelling, muscle cramps, muscle weakness, stiffness, arthritis, sciatica, restless legs, leg  pain at night, leg pain with exertion, rash, itching, dryness, suspicious lesions, paralysis, paresthesias, seizures, tremors, vertigo, transient blindness, frequent falls, frequent headaches, difficulty walking, depression, anxiety, memory loss, confusion, cold intolerance, heat intolerance, polydipsia, polyphagia, polyuria, unusual weight change, abnormal bruising, bleeding, enlarged lymph nodes, urticaria, allergic rash, hay fever, and recurrent infections.    Vital Signs:  Patient profile:   45 year old male Height:      71 inches Weight:      241.50 pounds BMI:     33.80 O2 Sat:      98 % on Room air Temp:     97.1 degrees F oral Pulse rate:   60 / minute BP sitting:   124 / 78  (right arm) Cuff size:   regular  Vitals Entered By: Randell Loop CMA (March 04, 2010 11:42 AM)  O2 Sat at Rest %:  98 O2 Flow:  Room air CC: Yearly ROV & CPX...   Physical Exam  Additional Exam:  WD, Overweight, 44 y/o BM in NAD... GENERAL:  Alert & oriented; pleasant & cooperative... HEENT:  [place]/AT, EOM-wnl, PERRLA, EACs-clear, TMs-wnl, NOSE-clear, THROAT-clear & wnl. NECK:  Supple w/ full ROM; no JVD; normal  carotid impulses w/o bruits; no thyromegaly or nodules palpated; no lymphadenopathy. CHEST:  Clear to P & A; without wheezes/ rales/ or rhonchi. HEART:  Regular Rhythm; without murmurs/ rubs/ or gallops. ABDOMEN:  Soft & nontender; normal bowel sounds; no organomegaly or masses detected. RECTAL:  Neg - prostate 2+ & min tender w/o nodules; testicles are atrophic; stool hematest neg. EXT: without deformities or arthritic changes; no varicose veins/ venous insuffic/ or edema. NEURO:  CN's intact; motor testing normal; sensory testing normal; gait normal & balance OK. DERM:  No lesions noted; no rash etc...    Impression & Recommendations:  Problem # 1:  PHYSICAL EXAMINATION (ICD-V70.0)  Orders: 12 Lead EKG (12 Lead EKG) T-2 View CXR (71020TC) T-Vitamin D (25-Hydroxy)  (82956-21308) TLB-BMP (Basic Metabolic Panel-BMET) (80048-METABOL) TLB-Hepatic/Liver Function Pnl (80076-HEPATIC) TLB-CBC Platelet - w/Differential (85025-CBCD) TLB-Lipid Panel (80061-LIPID) TLB-TSH (Thyroid Stimulating Hormone) (84443-TSH) TLB-Testosterone, Total (84403-TESTO)  Problem # 2:  OBSTRUCTIVE SLEEP APNEA (ICD-327.23) He declines repeat sleep study & feels he is doing satis w/o signif daytime hypersom in his opinion...  Problem # 3:  HYPERCHOLESTEROLEMIA (ICD-272.0) FLP looks good on diet + Simva40... His updated medication list for this problem includes:    Simvastatin 40 Mg Tabs (Simvastatin) .Marland Kitchen... Take 1 tab by mouth at bedtime...  Problem # 4:  OBESITY (ICD-278.00) We discussed weight reduction & need for diet + exercise...  Problem # 5:  CONSTIPATION (ICD-564.00) Colonoscopy 3/10 by DrJacobs was neg... f/u 2020. His updated medication list for this problem includes:    Miralax Powd (Polyethylene glycol 3350) ..... Mix 1 capful in water daily...  Problem # 6:  TESTOSTERONE DEFICIENCY (ICD-257.2) He has testic atrophy but states energy good, drive good, performance good, etc... he declines Urology referral or replacement Rx despite slowly declining testos level...  Problem # 7:  VITAMIN D DEFICIENCY (ICD-268.9) Assessment: Improved Rec to take 01-1998 u Vit D supplement daily...  Problem # 8:  OTHER MEDICAL PROBLEMS AS NOTED>>>  Complete Medication List: 1)  Simvastatin 40 Mg Tabs (Simvastatin) .... Take 1 tab by mouth at bedtime.Marland KitchenMarland Kitchen 2)  Miralax Powd (Polyethylene glycol 3350) .... Mix 1 capful in water daily.Marland KitchenMarland Kitchen 3)  Anusol-hc 2.5 % Crea (Hydrocortisone) .... Apply as directed two times a day... 4)  Acyclovir 200 Mg Caps (Acyclovir) .Marland Kitchen.. 1 capsule by mouth twice a day 5)  Zovirax 5 % Crea (Acyclovir) .... Apply as directed, as needed... 6)  Lotrisone 1-0.05 % Crea (Clotrimazole-betamethasone) .... Apply as needed for rash...  Patient Instructions: 1)  Today we  updated your med list- see below.... 2)  We refilled your meds for 2012... 3)  Today we did your follow up CXR, EKG, & FASTING blood work... please call the "phone tree" in a few days for your lab results.Marland KitchenMarland Kitchen 4)  Let me know if you want to pursue another SLEEP STUDY as we discussed.Marland KitchenMarland Kitchen 5)  Call for any problems.Marland KitchenMarland Kitchen 6)  Please schedule a follow-up appointment in 1 year. Prescriptions: LOTRISONE 1-0.05 %  CREA (CLOTRIMAZOLE-BETAMETHASONE) apply as needed for rash...  #1 tube x prn   Entered and Authorized by:   Michele Mcalpine MD   Signed by:   Michele Mcalpine MD on 03/04/2010   Method used:   Print then Give to Patient   RxID:   [id number] ZOVIRAX 5 %  CREA (ACYCLOVIR) apply as directed, as needed...  #1 tube x prn   Entered and Authorized by:   Michele Mcalpine MD   Signed by:   Lorin Picket  Elayne Snare MD on 03/04/2010   Method used:   Print then Give to Patient   RxID:   [id number] ACYCLOVIR 200 MG CAPS (ACYCLOVIR) 1 capsule by mouth twice a day  #60 x 12   Entered and Authorized by:   Michele Mcalpine MD   Signed by:   Michele Mcalpine MD on 03/04/2010   Method used:   Print then Give to Patient   RxID:   [id number] ANUSOL-HC 2.5 % CREA (HYDROCORTISONE) apply as directed two times a day...  #1 tube x prn   Entered and Authorized by:   Michele Mcalpine MD   Signed by:   Michele Mcalpine MD on 03/04/2010   Method used:   Print then Give to Patient   RxID:   [id number] SIMVASTATIN 40 MG  TABS (SIMVASTATIN) take 1 tab by mouth at bedtime...  #30 x 12   Entered and Authorized by:   Michele Mcalpine MD   Signed by:   Michele Mcalpine MD on 03/04/2010   Method used:   Print then Give to Patient   RxID:   [id number]    Immunization History:  Influenza Immunization History:    Influenza:  declined (03/04/2010)  Pneumovax Immunization History:    Pneumovax:  declined (03/04/2010)

## 2010-03-31 ENCOUNTER — Telehealth (INDEPENDENT_AMBULATORY_CARE_PROVIDER_SITE_OTHER): Payer: Self-pay | Admitting: *Deleted

## 2010-04-07 NOTE — Progress Notes (Signed)
Summary: runny nose   Phone Note Call from Patient Call back at Home Phone [phone]   Caller: Patient Call For: nadel Summary of Call: Pt c/o scratchy throat and runny nose since yesterday requests zpak and magic mouth wash called in.//[facility] Initial call taken by: Darletta Moll,  March 31, 2010 12:32 PM  Follow-up for Phone Call        called and spoke with pt.  pt states symptoms started yesterday. pt c/o nasal congestion/runny nose with clear nasal drainage and scratchy throat.  pt denies cough, f/c/s, head aches, facial pressure, sob, or sore throat.  pt is requesting rx for a z pak and mmw.  please advise.  Aundra Millet Reynolds LPN  March 31, 3218 3:14 PM  allergies: Codeine.  Additional Follow-up for Phone Call Additional follow up Details #1::        Zyrtec 10mg  by mouth at bedtime  Saline nasal rinses as needed  Tyelnol , fluids and rest  Please contact office for sooner follow up if symptoms do not improve or worsen  sounds like viral URI /rhinitis   Additional Follow-up by: Rubye Oaks NP,  March 31, 2010 4:12 PM    Additional Follow-up for Phone Call Additional follow up Details #2::    Called, spoke with pt.  He was informed of above recs per TP.  He became very upset bc this "is not what I need."  Stated "I go thru this once a year.  I know my body and I know what I need."  He is still requesting abx and mmw as he thinks this is what he actually needs.  Pls advise.  Thanks! Gweneth Dimitri RN  March 31, 2010 4:28 PM   Additional Follow-up for Phone Call Additional follow up Details #3:: Details for Additional Follow-up Action Taken: he has only had symptoms for 1 day- there is no way for anyone to say if this is viral or bacterial  he can have rx below to have on hold if symptoms do not improve with discolored mucus as antibiotics do not work for viruses  He has demanded the below and is unwilling to accept our original recommendations, next time he will need to  come in for ov to be seen.  yes he can have zpack #1 take as directed , no refills MMW 1 tsp four times a day as needed sore throat , swish and swallow#4 oz  no refills Please contact office for sooner follow up if symptoms do not improve or worsen  Additional Follow-up by: Rubye Oaks NP,  March 31, 2010 4:54 PM  New/Updated Medications: * MMW 1 tsp four times daily as needed sore throat - swish and swallow Prescriptions: MMW 1 tsp four times daily as needed sore throat - swish and swallow  #4oz x 0   Entered by:   Gweneth Dimitri RN   Authorized by:   Rubye Oaks NP   Signed by:   Gweneth Dimitri RN on 03/31/2010   Method used:   Faxed to ...       [facility] [address] (retail)       [address] [place], [place]  [address]       Ph: [phone]       Fax: [phone]   RxID:   [id number]    Called, spoke with pt.  He was informed we will send zpak to pharmacy but he  should wait to see if mucus becomes discolored because an abx will not work on a virus.  Also aware MMW will be sent to take 1 tsp qid as needed sore throat- swish and swallow.  He will call back if sxs do not improve or worse for OV.  He verbalized understanding of these instructions.  Gweneth Dimitri RN  March 31, 2010 5:14 PM  Appended Document: runny nose     Clinical Lists Changes  Medications: Added new medication of ZITHROMAX Z-PAK 250 MG TABS (AZITHROMYCIN) take as directed - Signed Rx of ZITHROMAX Z-PAK 250 MG TABS (AZITHROMYCIN) take as directed;  #1 x 0;  Signed;  Entered by: Gweneth Dimitri RN;  Authorized by: Rubye Oaks NP;  Method used: Electronically to [id number] [address] [place], [place], [place]  [address], Ph: [phone], Fax: [phone]    Prescriptions: ZITHROMAX Z-PAK 250 MG TABS (AZITHROMYCIN) take as directed  #1 x 0   Entered by:   Gweneth Dimitri RN   Authorized by:   Rubye Oaks NP   Signed by:   Gweneth Dimitri RN on 04/01/2010   Method used:    Electronically to        [facility] [phone][facility] (retail)       [address] [place], [place]  [address]       Ph: [phone]       Fax: [phone]   RxID:   [id number]    Appended Document: runny nose  received fax from [facility] stating pt called them stating MMW was sent in but zpak was not.  He was requesting this from them.  He was advise by RN to call Dr. Kriste Basque at the Pulmonary office for this request and was assisted with the phone number for the practice.    Zpak was supposed to be sent but was not in error.  Called, spoke with pt.  Apologized for this inconvience.  He is aware rx has now been sent and verbalized understanding.

## 2010-04-22 ENCOUNTER — Other Ambulatory Visit: Payer: Self-pay | Admitting: Pulmonary Disease

## 2010-06-12 NOTE — Assessment & Plan Note (Signed)
[facility]                             PULMONARY OFFICE NOTE   NAME:Gregory Santos, Gregory Santos                      MRN:          [id number]  DATE:05/17/2006                            DOB:          [birth year]    HISTORY OF PRESENT ILLNESS:  The patient is a 45 year old African  American male patient of Dr. Jodelle Green who has a known history of  hyperlipidemia and presents today for an acute office visit.  The  patient complains over the last 2 days, he has had a constant sensation  along the posterior part of his ear and scalp region that he describes  as a pressure sensation and as if the left side of his face is somewhat  numb or asleep.  The patient denies any known injury, visual changes,  chest pain, palpitations, extremity weakness, speech changes, difficulty  swallowing.  The patient does state that one side of his tongue feels  slightly different, but is unsure how to describe.   PAST MEDICAL HISTORY:  Mild obstructive sleep apnea with some restless  leg symptoms on sleep study.  Obesity, bronchitis, HSV-2 on chronic  Zovirax therapy, hyperlipidemia.   CURRENT MEDICATIONS:  1. Zovirax 400 mg b.i.d.  2. Simvastatin 40 mg nightly.   PHYSICAL EXAMINATION:  The patient is an obese male in no acute  distress.  He is afebrile.  Blood pressure 130/82. O2 saturation is 97% on room  air.  HEENT:  The patient has a mild incomplete lid closure on the left.  Very  slight asymmetry along the left with smiling and eye brow raises.  Nasal  mucosa is pale.  TMs are normal.  EACs are clear.  Posterior pharynx is  clear.  NECK:  Is supple without cervical adenopathy.  No JVD.  LUNGS:  Sounds are clear bilaterally.  CARDIAC:  S1, S2 without murmur, rub or gallop.  ABDOMEN:  Is soft, nontender, no palpable hepatosplenomegaly.  Bowel  sounds are positive throughout all 4 quadrants.  EXTREMITIES:  Are warm without any catches, cyanosis, clubbing or edema.  Equal  strength in the upper and lower extremities.  Normal gait.  Negative Romberg, negative pronator drift, negative tremor. Finger-to-  nose test is normal.  The patient is alert and oriented x3.  His speech  is normal.   IMPRESSION/PLAN:  Mild left-sided facial weakness, questionable  etiology. The patient will be referred over for a stat CT of the head to  rule out possible stroke.  If this is negative, suspect that patient's  symptoms may represent a mild to early onset of Bell's Palsy.  If CT is  negative, will continue on Zovirax therapy and start a prednisone pack  over the next week.  We will contact patient with CT results and  schedule a followup visit.  The patient is advised if  symptoms worsen or become associated with any other neurological  symptoms, he is to contact us immediately or go to the closest emergency  department.      Rubye Oaks, NP  Electronically Signed      Lonzo Cloud. Kriste Basque, MD  Electronically Signed   TP/MedQ  DD: 05/18/2006  DT: 05/18/2006  Job #: [id number]

## 2010-06-12 NOTE — Procedures (Signed)
NAME:  DETAVIOUS, RINN NO.:  [id number]   MEDICAL RECORD NO.:  [id number]          PATIENT TYPE:  OUT   LOCATION:  SLEEP CENTER                 F[facility]   PHYSICIAN:  Marcelyn Bruins, M.D. [facility] DATE OF BIRTH:  April 09, 1965   DATE OF STUDY:  08/19/2004                              NOCTURNAL POLYSOMNOGRAM   REFERRING PHYSICIAN:  Dr. Alroy Dust.   DATE OF STUDY:  August 19, 2004.   INDICATION FOR STUDY:  Hypersomnia with sleep apnea. Epworth score: 10.   SLEEP ARCHITECTURE:  The patient had total sleep time of 260 minutes with  significantly decreased slow wave sleep and REM. Sleep onset latency was  prolonged at 35 minutes and REM onset was normal. Sleep efficiency was  decreased at 69%.   IMPRESSION:  1.  Mild to moderate obstructive sleep apnea/hypopnea syndrome with a      respiratory disturbance index of 17 events per hour and O2 desaturation      as low as 93%. The events were somewhat worse in the supine position.      Treatment for this degree of sleep apnea may include weight loss alone,      upper airway evaluation, surgery, oral appliance, and C-PAP. Clinical      correlation is suggested.  2.  Moderate to loud snoring noted throughout.  3.  No clinically significant cardiac arrhythmia.  4.  Moderate numbers of leg jerks with significant sleep disruption.     ______________________________  Suzzette Righter    KC/MEDQ  D:  08/31/2004 16:10:09  T:  08/31/2004 23:21:05  Job:  [id number]

## 2010-06-12 NOTE — Letter (Signed)
May 20, 2006    C. Lesia Sago, M.D.  [address]  [address]  [place], [place] [address]   RE:  [name], [name]  MRN:  [id number]  /  DOB:  [birth year]   Dear [name]:   Mr. Gregory Santos is a 45 year old black gentleman whom I follow for  general medical purposes.  He is being referred with a request for a  neurologic consultation regarding left Bell's palsy.   [name] has enjoyed good general medical health.  He has a history of mild  bronchitis and obstructive sleep apnea in the past.  He has had a  history of chest wall pain and hypercholesterolemia.  His current  medications include Simvastatin 40 mg p.o. q.h.s. and Zovirax 200 mg  daily for which he takes for history of HSV2.   Mr. Faul had a routine physical in February and was doing quite well.  He came to the office this week with a new problem noting some weakness  in the left side of his face of sudden onset.  He noticed some  difficulty with chewing and taste on that side.  Exam showed a mild  Bell's palsy with difficulty shutting his eye on that side and asymmetry  of the facial musculature with smiling, etc.  I went ahead a placed him  on some prednisone therapy.  As noted, he is already taking some  Zovirax.  We did a CT brain for completeness, and this showed no acute  abnormalities.  His recent laboratory data was all basically normal.  His LDL was not under the tightest of control, and he apparently was not  taking his medication regularly.   It is hoped that the prednisone will help his left Bell's  symptomatology.  Because of his young age, we felt that neurological  consultation was in order.   As always, thank you for your evaluation, and I look forward to hearing  your comments on this.    Sincerely,      Gregory Santos. Gregory Basque, MD  Electronically Signed    SMN/MedQ  DD: 05/20/2006  DT: 05/20/2006  Job #: [id number]

## 2010-11-12 ENCOUNTER — Telehealth: Payer: Self-pay | Admitting: Pulmonary Disease

## 2010-11-12 MED ORDER — FIRST-DUKES MOUTHWASH MT SUSP: OROMUCOSAL | Status: DC

## 2010-11-12 MED ORDER — AZITHROMYCIN 250 MG PO TABS: ORAL_TABLET | ORAL | Status: AC

## 2010-11-12 NOTE — Telephone Encounter (Signed)
Per SN---ok to give zpak and mmw .  Called and spoke with pt and he c/o sore and scratchy throat.  Pt is aware that zpak and mmw has been sent to the pharmacy.

## 2011-03-05 ENCOUNTER — Other Ambulatory Visit: Payer: Self-pay | Admitting: Pulmonary Disease

## 2011-03-05 MED ORDER — HYDROCORTISONE 2.5 % RE CREA: TOPICAL_CREAM | Freq: Two times a day (BID) | RECTAL | Status: DC

## 2011-03-05 NOTE — Telephone Encounter (Signed)
RX sent

## 2011-03-09 ENCOUNTER — Telehealth: Payer: Self-pay | Admitting: Pulmonary Disease

## 2011-03-09 MED ORDER — HYDROCORTISONE 2.5 % RE CREA: TOPICAL_CREAM | Freq: Two times a day (BID) | RECTAL | Status: DC

## 2011-03-09 MED ORDER — ACYCLOVIR 200 MG PO CAPS: 200.0000 mg | ORAL_CAPSULE | Freq: Two times a day (BID) | ORAL | Status: DC

## 2011-03-09 MED ORDER — CLOTRIMAZOLE-BETAMETHASONE 1-0.05 % EX CREA: TOPICAL_CREAM | CUTANEOUS | Status: DC

## 2011-03-09 MED ORDER — SIMVASTATIN 40 MG PO TABS: 40.0000 mg | ORAL_TABLET | Freq: Every day | ORAL | Status: DC

## 2011-03-09 NOTE — Telephone Encounter (Signed)
We can send in a 30 day supply for his meds to the pharmacy but not a 90 day supply.  He will need to schedule an appt with SN prior to these being sent to the pharmacy.  Pt was last seen by SN on 03/04/2010.  thanks

## 2011-03-09 NOTE — Telephone Encounter (Signed)
Called and spoke with pt. Informed him of SN's response.  Rx sent to pharmacy. And pt scheduled f/u appt with SN for 03/23/11 at 9:00 am

## 2011-03-09 NOTE — Telephone Encounter (Signed)
I spoke with pt and he states he can't come in for OV right now bc his wife and sister is sick. Pt is requesting a 90 day supply of acyclovir sent to [facility] on [address] and a 90 day supply of simvastatin, hydrocortisone cream, and lotrisone cream [facility]. Pt last OV was 01/29/08. Pt states this has been done for him in the past. Please advise Dr. Kriste Basque, thanks

## 2011-03-12 ENCOUNTER — Encounter: Payer: Self-pay | Admitting: *Deleted

## 2011-03-23 ENCOUNTER — Encounter: Payer: Self-pay | Admitting: Pulmonary Disease

## 2011-03-23 ENCOUNTER — Other Ambulatory Visit (INDEPENDENT_AMBULATORY_CARE_PROVIDER_SITE_OTHER): Payer: PRIVATE HEALTH INSURANCE

## 2011-03-23 ENCOUNTER — Ambulatory Visit (INDEPENDENT_AMBULATORY_CARE_PROVIDER_SITE_OTHER): Payer: PRIVATE HEALTH INSURANCE | Admitting: Pulmonary Disease

## 2011-03-23 VITALS — BP 138/90 | HR 75 | Temp 99.3°F | Ht 70.0 in | Wt 258.0 lb

## 2011-03-23 DIAGNOSIS — E291 Testicular hypofunction: Secondary | ICD-10-CM

## 2011-03-23 DIAGNOSIS — E559 Vitamin D deficiency, unspecified: Secondary | ICD-10-CM

## 2011-03-23 DIAGNOSIS — G2581 Restless legs syndrome: Secondary | ICD-10-CM

## 2011-03-23 DIAGNOSIS — Z0000 Encounter for general adult medical examination without abnormal findings: Secondary | ICD-10-CM

## 2011-03-23 DIAGNOSIS — B009 Herpesviral infection, unspecified: Secondary | ICD-10-CM

## 2011-03-23 DIAGNOSIS — K5900 Constipation, unspecified: Secondary | ICD-10-CM

## 2011-03-23 DIAGNOSIS — E669 Obesity, unspecified: Secondary | ICD-10-CM

## 2011-03-23 DIAGNOSIS — E7800 Pure hypercholesterolemia, unspecified: Secondary | ICD-10-CM

## 2011-03-23 DIAGNOSIS — G4733 Obstructive sleep apnea (adult) (pediatric): Secondary | ICD-10-CM

## 2011-03-23 LAB — CBC WITH DIFFERENTIAL/PLATELET
Basophils Relative: 0.3 % (ref 0.0–3.0)
Eosinophils Relative: 2.7 % (ref 0.0–5.0)
HCT: 42.1 % (ref 39.0–52.0)
Hemoglobin: 13.9 g/dL (ref 13.0–17.0)
MCV: 89.1 fl (ref 78.0–100.0)
Monocytes Absolute: 0.5 10*3/uL (ref 0.1–1.0)
Neutrophils Relative %: 65.7 % (ref 43.0–77.0)
RBC: 4.72 Mil/uL (ref 4.22–5.81)
WBC: 9 10*3/uL (ref 4.5–10.5)

## 2011-03-23 LAB — HEPATIC FUNCTION PANEL: Total Bilirubin: 0.5 mg/dL (ref 0.3–1.2)

## 2011-03-23 LAB — LIPID PANEL
Cholesterol: 149 mg/dL (ref 0–200)
LDL Cholesterol: 91 mg/dL (ref 0–99)
Triglycerides: 52 mg/dL (ref 0.0–149.0)
VLDL: 10.4 mg/dL (ref 0.0–40.0)

## 2011-03-23 LAB — BASIC METABOLIC PANEL
BUN: 10 mg/dL (ref 6–23)
Calcium: 9.8 mg/dL (ref 8.4–10.5)
Creatinine, Ser: 0.7 mg/dL (ref 0.4–1.5)
GFR: 155.01 mL/min (ref >60.00)

## 2011-03-23 MED ORDER — ACYCLOVIR 200 MG PO CAPS: 200.0000 mg | ORAL_CAPSULE | Freq: Two times a day (BID) | ORAL | Status: DC

## 2011-03-23 MED ORDER — SIMVASTATIN 40 MG PO TABS: 40.0000 mg | ORAL_TABLET | Freq: Every day | ORAL | Status: DC

## 2011-03-23 MED ORDER — TESTOSTERONE 12.5 MG/ACT (1%) TD GEL: 4.0000 | Freq: Every day | TRANSDERMAL | Status: DC

## 2011-03-23 MED ORDER — ROPINIROLE HCL 0.5 MG PO TABS: ORAL_TABLET | ORAL | Status: DC

## 2011-03-23 NOTE — Patient Instructions (Addendum)
Today we updated your med list in our EPIC system...    Continue your current medications the same...  We added ANDROGEL 1%- 4 pumps of the gel rubbed into the skin daily... We added REQUIP 0.5mg - one tab each evening & increase to 2 tabs after 3-4 weeks (for the restless leg syndrome)  Today we did your follow up fasting blood work...    Please call the [facility]REE in a few days for your results...    Dial [id number] & when prompted enter your patient number followed by the # symbol...    Your patient number is:  [id number]#  We need to determine if the androgel is working> let's plan a follow up appt in about 3 months.Marland KitchenMarland Kitchen

## 2011-03-23 NOTE — Progress Notes (Signed)
Subjective:     Patient ID: Gregory Santos, male   DOB: [birth year], 46 y.o.   MRN: [id number]  HPI 46 y/o BM here for a f/u visit... he has several med problems as listed below...   ~  February 20, 2009:  saw blood in urine yest (1st time ever), at end of stream, no pain or dysuria... known atrophic testes & low testosterone w/o symptoms, no hx kidney stones etc... we discussed checking labs (nl x low-T), urine (clear), CT Abd (neg-NAD)... otherw he's had a good yr- no other complaints or concerns...  ~  March 04, 2010:  here for yearly check up> feeling well, good energy, no new complaints or concerns...  he continues to have some sleep issues- nocturia x2, wakes tired, sleepy in eve w/TV, but refuses repeat sleep study;  Chol well controlled on Simva40, needs better diet & wt reduction;  still declines Testos replacement Rx or Urology consult;  Labs reviewed> OK x low-T & VitD=30 (rec OTC 1000u daily)...  ~  March 23, 2011:  Yearly ROV & Helios continues to feel well, good energy, no new complaints or concerns;  We reviewed his meds, checked FASTING blood work, offered Flu vaccine but he declines "I use soap & water"...    Hx OSA & RLS> still fatigued a lot & falls asleep in church; offered repeat sleep study but he refuses; rec weight loss; he has agreed to trial Requip for the RLS...    Chol> on Simva40; FLP looks good w/ TChol 149, TG 52, HDL 47, LDL 91...    Obesity> needs to do way better on diet + exercise; weight today= 258#, up 16# over the last yr; he'll join a gym he says...    Low-T> known hx atrophic testes likely from mumps orchitis; Testos level = 277 (250-890) & he won't admit to any signif Low-T symptoms stating that drive, desire, performance are all OK; he now agrees to trial of ANDROGEL 1%- 4pumps daily... LABS 2/13 showed:  FLP- ok on Simva40;  Chems- wnl;  CBC- wnl;  TSH=1.16;  Testos=277;  VitD=38   Problem List:    Hx of BRONCHITIS (ICD-490) - no recent symptoms, doing  well.  OBSTRUCTIVE SLEEP APNEA (ICD-327.23) - sleep study 7/06 w/ RDI 17, desat to 93%, mod snoring, no arrhythmias... he had signif leg jerks, therefore Requip given, but he stopped & denies symptoms... ~  2/13:  Offered repeat sleep study to pt but he declines again; agrees to trial Requip for the RLS...  CHEST WALL PAIN, HX OF (ICD-V15.89)  HYPERCHOLESTEROLEMIA (ICD-272.0) - on SIMVASTATIN 40mg /d... ~  FLP 2/08 showed TChol 201, TG 58, HDL 36, LDL 158... Simvastatin 40mg /d started then. ~  FLP 4/09 showed TChol 141, TG 34, HDL 33, LDL 101... rec- same med, better diet. ~  FLP 1/10 showed TChol 115, TG 29, HDL 40, LDL 69 ~  FLP 1/11 showed TChol 122, TG 32, HDL 47, LDL 68 ~  FLP 2/12 on Simva40 showed TChol 131, TG 32, HDL 44, LDL 81 ~  FLP 2/13 on Simva40 showed TChol 149, TG 52, HDL 47, LDL 91  OBESITY (ICD-278.00) -  ~  weight 1/10 = 249#, up 9# since last OV.Marland Kitchen. ~  weight 1/11 = 246# ~  weight 2/12 = 242# ~  Weight 2/13 = 258#... We reviewed diet, exercise, wt reduction strategies...  CONSTIPATION (ICD-564.00) - on Miralax + Anusol HC Prn... Hx blood in stool & referred to GI w/ eval  DrJacobs 3/10- colonoscopy showed hemorroid + 1 sm polyp= lymphoid aggregate, f/u 63yrs.  TESTOSTERONE DEFICIENCY (ICD-257.2) - he has testic atrophy on exam, but remains asymptomatic w/ good energy, drive, performance, etc & he declines replacement Rx or Urology consultation... ~  labs 3/09 showed testos level = 309 (350-890) ~  labs 1/11 showed testos level = 299 ~  labs 2/12 showed testos level = 230... he still declines replacement Rx. ~  Labs 2/13 showed Testos level = 277... He agrees to trial ANDROGEL 1%- 4pumps daily...  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  HEADACHE (ICD-784.0) - prev Rx w/ Topomax 100mg /d and Flexeril 10mg Prn per DrFreeman  Hx of BELLS PALSY (ICD-351.0)  RESTLESS LEG SYNDROME (ICD-333.94) - not currently on Rx- prev  Requip but denied symptoms and stopped it on his own... ~  2/13: notes that leg movements annoy wife & wake him up at night; agrees to trial REQUIP 0.5mg - 1Qhs ==> incr to 2Qhs...  ANXIETY (ICD-300.00)  VITAMIN D DEFICIENCY (ICD-268.9) - on Vit D OTC 1000 u daily... ~  labs 1/10 showed Vit D level = 16... rec> OTC Vit D 01-1998 daily. ~  labs 2/12 showed Vit D level = 30... rec to continue 01-1998 u daily. ~  Labs 2/13 showed Vit D level = 38... rec to take 2000u Vit D daily supplement...  Hx of IRON DEFICIENCY (ICD-280.9) - on OTC Fe supplement daily... ~  labs 1/10 showed Hg= 14.6, MCV= 91, Fe= 37 (sat=10%) ~  labs 1/11 showed Hg= 13.5, MCV= 93, Fe= 85 (sat=26%) ~  labs 2/12 showed Hg= 14.0, MCV= 90 ~  Labs 2/13 showed Hg= 13.9, MCV= 89   Past Surgical History  Procedure Date  . Right inguinal hernia repair as a child     Outpatient Encounter Prescriptions as of 03/23/2011  Medication Sig Dispense Refill  . acyclovir (ZOVIRAX) 200 MG capsule Take 1 capsule (200 mg total) by mouth 2 (two) times daily.  60 capsule  3  . acyclovir cream (ZOVIRAX) 5 % Apply 1 application topically every 3 (three) hours as needed.      . clotrimazole-betamethasone (LOTRISONE) cream Apply topically as directed. As needed for rash  30 g  0  . Diphenhyd-Hydrocort-Nystatin (FIRST-DUKES MOUTHWASH) SUSP 5 ml gargle and swallow four times daily as needed  120 mL  1  . polyethylene glycol (MIRALAX / GLYCOLAX) packet Take 17 g by mouth daily as needed.       . simvastatin (ZOCOR) 40 MG tablet Take 1 tablet (40 mg total) by mouth daily.  30 tablet  5  . DISCONTD: acyclovir (ZOVIRAX) 200 MG capsule Take 1 capsule (200 mg total) by mouth 2 (two) times daily.  60 capsule  0  . DISCONTD: simvastatin (ZOCOR) 40 MG tablet Take 1 tablet (40 mg total) by mouth daily.  30 tablet  0  . rOPINIRole (REQUIP) 0.5 MG tablet Take as directed  60 tablet  3  . Testosterone (ANDROGEL PUMP) 1.25 GM/ACT (1%) GEL Place 4 Squirts onto the  skin daily.  75 g  3    Allergies  Allergen Reactions  . Codeine Phosphate     REACTION: nausea and vomiting    Current Medications, Allergies, Past Medical History, Past Surgical History, Family History, and Social History were reviewed in [employer] record.   Review of Systems    The patient denies fever, chills, sweats, anorexia, fatigue, weakness, malaise, weight  loss, sleep disorder, blurring, diplopia, eye irritation, eye discharge, vision loss, eye pain, photophobia, earache, ear discharge, tinnitus, decreased hearing, nasal congestion, nosebleeds, sore throat, hoarseness, chest pain, palpitations, syncope, dyspnea on exertion, orthopnea, PND, peripheral edema, cough, dyspnea at rest, excessive sputum, hemoptysis, wheezing, pleurisy, nausea, vomiting, diarrhea, constipation, change in bowel habits, abdominal pain, melena, hematochezia, jaundice, gas/bloating, indigestion/heartburn, dysphagia, odynophagia, dysuria, hematuria, urinary frequency, urinary hesitancy, nocturia, incontinence, back pain, joint pain, joint swelling, muscle cramps, muscle weakness, stiffness, arthritis, sciatica, restless legs, leg pain at night, leg pain with exertion, rash, itching, dryness, suspicious lesions, paralysis, paresthesias, seizures, tremors, vertigo, transient blindness, frequent falls, frequent headaches, difficulty walking, depression, anxiety, memory loss, confusion, cold intolerance, heat intolerance, polydipsia, polyphagia, polyuria, unusual weight change, abnormal bruising, bleeding, enlarged lymph nodes, urticaria, allergic rash, hay fever, and recurrent infections.     Objective:   Physical Exam    WD, Overweight, 45 y/o BM in NAD... GENERAL:  Alert & oriented; pleasant & cooperative... HEENT:  [place]/AT, EOM-wnl, PERRLA, EACs-clear, TMs-wnl, NOSE-clear, THROAT-clear & wnl. NECK:  Supple w/ full ROM; no JVD; normal carotid impulses w/o bruits; no thyromegaly or nodules  palpated; no lymphadenopathy. CHEST:  Clear to P & A; without wheezes/ rales/ or rhonchi. HEART:  Regular Rhythm; without murmurs/ rubs/ or gallops. ABDOMEN:  Soft & nontender; normal bowel sounds; no organomegaly or masses detected. RECTAL:  Neg - prostate 2+ & min tender w/o nodules; testicles are atrophic; stool hematest neg. EXT: without deformities or arthritic changes; no varicose veins/ venous insuffic/ or edema. NEURO:  CN's intact; motor testing normal; sensory testing normal; gait normal & balance OK. DERM:  No lesions noted; no rash etc...  RADIOLOGY DATA:  Reviewed in the EPIC EMR & discussed w/ the patient...    >>Last CXR 2/12 showed normal heart size, clear lungs, NAD...    >>EKG 2/12 showed NSR, rate60, sl incr voltage and ?early repol...  LABORATORY DATA:  Reviewed in the EPIC EMR & discussed w/ the patient...    >>LABS 2/13 showed:  FLP- ok on Simva40;  Chems- wnl;  CBC- wnl;  TSH=1.16;  Testos=277;  VitD=38   Assessment:     OSA/ RLS>  He refuses repeat sleep study;  We reviewed the importance of weight reduction;  He does agree to trial REQUIP for the RLS...  CHOL>  On Simva40 & FLP looks good; continue diet/ exercise/ get wt down/ continue Simva40...  Obesity>  Weight reduction is the key to several of his problems...  Constip/ mild rectal stricture>  Discussed Miralax vs Senokot-S regularly...  Testos defic>  He denies symptoms but agrees top trial of ANDROGEL to see if he feels better...  Hx HSV2>  On Acyclovir suppression & uses zovirax ointment as needed...  Vit D Defic>  rec to increase his Vit D OTC supplement to 2000u daily...     Plan:     Patient's Medications  New Prescriptions   ROPINIROLE (REQUIP) 0.5 MG TABLET    Take as directed   TESTOSTERONE (ANDROGEL PUMP) 1.25 GM/ACT (1%) GEL    Place 4 Squirts onto the skin daily.  Previous Medications   ACYCLOVIR CREAM (ZOVIRAX) 5 %    Apply 1 application topically every 3 (three) hours as needed.    CLOTRIMAZOLE-BETAMETHASONE (LOTRISONE) CREAM    Apply topically as directed. As needed for rash   DIPHENHYD-HYDROCORT-NYSTATIN (FIRST-DUKES MOUTHWASH) SUSP    5 ml gargle and swallow four times daily as needed   POLYETHYLENE GLYCOL (MIRALAX / GLYCOLAX) PACKET  Take 17 g by mouth daily as needed.   Modified Medications   Modified Medication Previous Medication   ACYCLOVIR (ZOVIRAX) 200 MG CAPSULE acyclovir (ZOVIRAX) 200 MG capsule      Take 1 capsule (200 mg total) by mouth 2 (two) times daily.    Take 1 capsule (200 mg total) by mouth 2 (two) times daily.   SIMVASTATIN (ZOCOR) 40 MG TABLET simvastatin (ZOCOR) 40 MG tablet      Take 1 tablet (40 mg total) by mouth daily.    Take 1 tablet (40 mg total) by mouth daily.  Discontinued Medications   No medications on file

## 2011-03-24 LAB — TESTOSTERONE, FREE, TOTAL, SHBG
Testosterone-% Free: 1.5 % — ABNORMAL LOW (ref 1.6–2.9)
Testosterone: 277 ng/dL (ref 250–890)

## 2011-03-24 LAB — VITAMIN D 25 HYDROXY (VIT D DEFICIENCY, FRACTURES): Vit D, 25-Hydroxy: 38 ng/mL (ref 30–89)

## 2011-03-28 ENCOUNTER — Encounter: Payer: Self-pay | Admitting: Pulmonary Disease

## 2011-03-29 ENCOUNTER — Telehealth: Payer: Self-pay | Admitting: Pulmonary Disease

## 2011-03-29 NOTE — Telephone Encounter (Signed)
Per SN: okay for axiron.  Called spoke with patient, advised of SN's recs as stated above.  Information sheet and voucher provided per SN and placed up front for pt to pick up at his convenience.  rx sent.

## 2011-03-29 NOTE — Telephone Encounter (Signed)
We received a PA on Androgel on 03-26-11. I called [phone] and asked for covered alternatives which are Androderm patch, fortesta gel10mg /2%, or axiron pump 30mg  are all covered without a prior auth. Will any of these be an acceptable alternative for the pt? Please advise. Pt aware.  Carron Curie, CMA Allergies  Allergen Reactions  . Codeine Phosphate     REACTION: nausea and vomiting

## 2011-03-30 ENCOUNTER — Telehealth: Payer: Self-pay | Admitting: Pulmonary Disease

## 2011-03-30 MED ORDER — TESTOSTERONE 30 MG/ACT TD SOLN: 2.0000 "application " | Freq: Every day | TRANSDERMAL | Status: DC

## 2011-03-30 MED ORDER — HYDROCOD POLST-CHLORPHEN POLST 10-8 MG/5ML PO LQCR: 5.0000 mL | Freq: Two times a day (BID) | ORAL | Status: DC | PRN

## 2011-03-30 NOTE — Telephone Encounter (Signed)
Pt c/o dry cough for 2 days.  Denies sinus drainage or congestion, fever , sob or wheezing.  Only symptom is dry cough.  Pt has not tried anything OTC because he reports that the prescription works best.  Please advise. Allergies  Allergen Reactions  . Codeine Phosphate     REACTION: nausea and vomiting

## 2011-03-30 NOTE — Telephone Encounter (Signed)
rx sent. Pt aware.Lacole Komorowski, CMA  

## 2011-03-30 NOTE — Telephone Encounter (Signed)
Per SN---ok to have the tussionex #4oz  1 tsp every 12 hours as needed for cough.

## 2011-03-30 NOTE — Telephone Encounter (Signed)
rx cannot be sent electronically > telephoned to the voicemail at [facility] on [address].

## 2011-03-31 ENCOUNTER — Other Ambulatory Visit: Payer: Self-pay | Admitting: Pulmonary Disease

## 2011-04-01 ENCOUNTER — Other Ambulatory Visit: Payer: Self-pay | Admitting: Pulmonary Disease

## 2011-04-06 ENCOUNTER — Telehealth: Payer: Self-pay | Admitting: Pulmonary Disease

## 2011-04-06 ENCOUNTER — Telehealth: Payer: Self-pay | Admitting: *Deleted

## 2011-04-06 MED ORDER — AMOXICILLIN-POT CLAVULANATE 875-125 MG PO TABS: 1.0000 | ORAL_TABLET | Freq: Two times a day (BID) | ORAL | Status: AC

## 2011-04-06 NOTE — Telephone Encounter (Signed)
Per SN give augmentin 875, #14 take 1 tablet bid, and align once daily. Carron Curie, CMA  rx sent. Pt aware.Carron Curie, CMA

## 2011-04-06 NOTE — Telephone Encounter (Signed)
Made in error

## 2011-04-06 NOTE — Telephone Encounter (Addendum)
Called and spoke with pt about his results---he stated that he has been on the axiron for about 1 week.  Pt stated that he is tired all the time, no energy to do anything, wants to lay around all the time, he has been coughing up yellow sputum, chills and sweats and he has been using mucinex.  Pt is requesting that something be called to his pharmacy for him.  Pt is aware that he will need to call us back once he has been on the axiron x 6-8 weeks so he can return for repeat labs.  Pt voiced his understanding of this.  SN please advise. Thanks   Can reach pt at [phone].  Allergies  Allergen Reactions  . Codeine Phosphate     REACTION: nausea and vomiting

## 2011-04-08 ENCOUNTER — Telehealth: Payer: Self-pay | Admitting: Pulmonary Disease

## 2011-04-08 NOTE — Telephone Encounter (Signed)
I spoke with pt and advised him of TP recs. He stated he is not having any chest pains/tightness. Pt aware to call back if he worsens or seek emergency care. Nothing further was needed

## 2011-04-08 NOTE — Telephone Encounter (Signed)
If he is having chest pain /tightness needs be evaluated in ER . Prednisone will not help .  Please contact office for sooner follow up if symptoms do not improve or worsen or seek emergency care

## 2011-04-08 NOTE — Telephone Encounter (Signed)
Called and spoke with pt. Pt states SN called him in Augmentin 2 days ago but now pt is calling today stating he needs a pred taper.  I informed pt he needed to give the abx more time to work and clear up the infection but pt insisted he needed a pred taper.  Pt states he is more sob but denies tightness in chest or wheezing.  Pt is still coughing up yellow colored sputum.  States he had a fever in the past but not currently.  States symptoms started last week on Tuesday.  TP, please advise.  Thanks! Allergies  Allergen Reactions  . Codeine Phosphate     REACTION: nausea and vomiting

## 2011-04-20 ENCOUNTER — Other Ambulatory Visit: Payer: Self-pay | Admitting: Pulmonary Disease

## 2011-04-20 MED ORDER — SIMVASTATIN 40 MG PO TABS: 40.0000 mg | ORAL_TABLET | Freq: Every day | ORAL | Status: DC

## 2011-04-20 MED ORDER — ROPINIROLE HCL 0.5 MG PO TABS: ORAL_TABLET | ORAL | Status: DC

## 2011-04-20 NOTE — Telephone Encounter (Signed)
[facility] for  ropinirole 0.5 mg , Simvastatin 40 mg  Allergies  Allergen Reactions  . Codeine Phosphate     REACTION: nausea and vomiting

## 2011-05-10 ENCOUNTER — Telehealth: Payer: Self-pay | Admitting: Pulmonary Disease

## 2011-05-10 MED ORDER — TESTOSTERONE 10 MG/ACT (2%) TD GEL: 4.0000 "application " | Freq: Every day | TRANSDERMAL | Status: DC

## 2011-05-10 NOTE — Telephone Encounter (Signed)
Pt returned call. Gregory Santos  

## 2011-05-10 NOTE — Telephone Encounter (Signed)
[id number].Talynn Lebon, CMA  

## 2011-05-10 NOTE — Telephone Encounter (Signed)
Returning call.

## 2011-05-10 NOTE — Telephone Encounter (Signed)
Nothing further needed 

## 2011-05-10 NOTE — Telephone Encounter (Signed)
Per SN: get email address from pt, will print out rx and coupon and leave upfront for p/u  [id number] x1

## 2011-05-10 NOTE — Telephone Encounter (Signed)
I spoke with the pt and he states that the axiron is burning when he applies it to underarms. He states when he first used it it would burn after application but the burn would go away, but now he states the burn doe snot go away and it is much worse. He states it has left his underarms so sore to the point where it is painful to apply deodorant. He states he last used the axiron on Saturday. He states the burning has improved since he has not used the med in 2 days but it is still there. He is asking what to do next. If the med is going to be changed there are only 2 other meds that are covered by his insurance without Prior auth and they are : Androderm patch, fortesta gel10mg /2%. Pt was originally prescribed androgel pump but this required Prior auth and we were told that he had to try and fail alternatives, so we could try another prior auth for androgel if this is preferred.  Please advise. Carron Curie, CMA

## 2011-05-10 NOTE — Telephone Encounter (Signed)
I spoke with pt and states his email address is [email or web address]. Pt aware rx and coupon left upfront for p/u. Please advise sn THANKS

## 2011-05-10 NOTE — Telephone Encounter (Signed)
[facility] [address]

## 2011-06-30 ENCOUNTER — Telehealth: Payer: Self-pay | Admitting: Pulmonary Disease

## 2011-06-30 NOTE — Telephone Encounter (Signed)
I spoke with the pt and he states that his supervisor is not going to let him off for his appt on 07-22-11 so he is wanting to reschedule this appt. He states that he wants his appt before July 4th, and that Dr. Kriste Basque wanted to see him before this as well. Please advise. Carron Curie, CMA

## 2011-06-30 NOTE — Telephone Encounter (Signed)
appt set. Pt is aware. Annora Guderian, CMA  

## 2011-06-30 NOTE — Telephone Encounter (Signed)
Can add pt on for 6-11 at 11:30.

## 2011-07-06 ENCOUNTER — Ambulatory Visit (INDEPENDENT_AMBULATORY_CARE_PROVIDER_SITE_OTHER): Payer: PRIVATE HEALTH INSURANCE | Admitting: Pulmonary Disease

## 2011-07-06 ENCOUNTER — Telehealth: Payer: Self-pay | Admitting: Pulmonary Disease

## 2011-07-06 VITALS — BP 126/88 | HR 55 | Temp 98.0°F | Resp 16 | Ht 71.0 in | Wt 252.4 lb

## 2011-07-06 DIAGNOSIS — E291 Testicular hypofunction: Secondary | ICD-10-CM

## 2011-07-06 DIAGNOSIS — E7800 Pure hypercholesterolemia, unspecified: Secondary | ICD-10-CM

## 2011-07-06 DIAGNOSIS — R51 Headache: Secondary | ICD-10-CM

## 2011-07-06 DIAGNOSIS — K5900 Constipation, unspecified: Secondary | ICD-10-CM

## 2011-07-06 DIAGNOSIS — F411 Generalized anxiety disorder: Secondary | ICD-10-CM

## 2011-07-06 DIAGNOSIS — E669 Obesity, unspecified: Secondary | ICD-10-CM

## 2011-07-06 DIAGNOSIS — G4733 Obstructive sleep apnea (adult) (pediatric): Secondary | ICD-10-CM

## 2011-07-06 DIAGNOSIS — B009 Herpesviral infection, unspecified: Secondary | ICD-10-CM

## 2011-07-06 MED ORDER — ROPINIROLE HCL 0.5 MG PO TABS: ORAL_TABLET | ORAL | Status: DC

## 2011-07-06 MED ORDER — SIMVASTATIN 40 MG PO TABS: 40.0000 mg | ORAL_TABLET | Freq: Every day | ORAL | Status: DC

## 2011-07-06 MED ORDER — ACYCLOVIR 5 % EX CREA: 1.0000 "application " | TOPICAL_CREAM | CUTANEOUS | Status: DC | PRN

## 2011-07-06 MED ORDER — CLOTRIMAZOLE-BETAMETHASONE 1-0.05 % EX CREA: TOPICAL_CREAM | CUTANEOUS | Status: DC

## 2011-07-06 MED ORDER — ACYCLOVIR 5 % EX OINT: TOPICAL_OINTMENT | CUTANEOUS | Status: DC

## 2011-07-06 MED ORDER — TESTOSTERONE 10 MG/ACT (2%) TD GEL: 4.0000 "application " | Freq: Every day | TRANSDERMAL | Status: DC

## 2011-07-06 MED ORDER — ACYCLOVIR 200 MG PO CAPS: 200.0000 mg | ORAL_CAPSULE | Freq: Two times a day (BID) | ORAL | Status: DC

## 2011-07-06 NOTE — Telephone Encounter (Signed)
Per SN okay to do so. I have called [facility] and advised them of thi. Nothing further was needed

## 2011-07-06 NOTE — Patient Instructions (Addendum)
Today we updated your med list in our EPIC system...    Continue your current medications the same...    We refilled your meds per request...  Let's get on track w/ our diet & exercise program...    The goal is to lose 15-20 lbs...  Call for any questions...  Let's plan a follow up visit in about 6 months.Marland KitchenMarland Kitchen

## 2011-07-11 ENCOUNTER — Encounter: Payer: Self-pay | Admitting: Pulmonary Disease

## 2011-07-11 NOTE — Progress Notes (Signed)
Subjective:     Patient ID: Gregory Santos, male   DOB: Apr 17, 1965, 46 y.o.   MRN: [id number]  HPI 46 y/o BM here for a f/u visit... he has several med problems as listed below...   ~  Jan11:  saw blood in urine yest (1st time ever), at end of stream, no pain or dysuria... known atrophic testes & low testosterone w/o symptoms, no hx kidney stones etc... we discussed checking labs (nl x low-T), urine (clear), CT Abd (neg-NAD)... otherw he's had a good yr- no other complaints or concerns...  ~  March 04, 2010:  here for yearly check up> feeling well, good energy, no new complaints or concerns...  he continues to have some sleep issues- nocturia x2, wakes tired, sleepy in eve w/TV, but refuses repeat sleep study;  Chol well controlled on Simva40, needs better diet & wt reduction;  still declines Testos replacement Rx or Urology consult;  Labs reviewed> OK x low-T & VitD=30 (rec OTC 1000u daily)...  ~  March 23, 2011:  Yearly ROV & [name] continues to feel well, good energy, no new complaints or concerns;  We reviewed his meds, checked FASTING blood work, offered Flu vaccine but he declines "I use soap & water"...    Hx OSA & RLS> still fatigued a lot & falls asleep in church; offered repeat sleep study but he refuses; rec weight loss; he has agreed to trial Requip for the RLS...    Chol> on Simva40; FLP looks good w/ TChol 149, TG 52, HDL 47, LDL 91...    Obesity> needs to do way better on diet + exercise; weight today= 258#, up 16# over the last yr; he'll join a gym he says...    Low-T> known hx atrophic testes likely from mumps orchitis; Testos level = 277 (250-890) & he won't admit to any signif Low-T symptoms stating that drive, desire, performance are all OK; he now agrees to trial of ANDROGEL 1%- 4pumps daily... LABS 2/13 showed:  FLP- ok on Simva40;  Chems- wnl;  CBC- wnl;  TSH=1.16;  Testos=277;  VitD=38  ~  July 06, 2011:  17mo ROV  & Gregory Santos has stopped his meds> Simva40 & [name] because  his wife did some research & found out that Simvastatin can lower Testosterone levels & she wanted him off it; I explained to pt that his hypogonadism is most likely the result of an old infection like Mumps and it's effect on his testicles which are atrophic and not from any of his meds; since his Chol had been quite elev in the past before medication, he is advised to stay on Statin Rx although I offered to change his med from Simvastatin to another med if he likes (he said his mother wanted him to continue his same meds & this is ultimately what he decided to do)...     We reviewed prob list, meds, xrays and labs> see below>>   Problem List:    Hx of BRONCHITIS (ICD-490) - no recent symptoms, doing well. ~  CXR 2/12 showed normal heart size, clear lungs, NAD...  OBSTRUCTIVE SLEEP APNEA (ICD-327.23) - sleep study 7/06 w/ RDI 17, desat to 93%, mod snoring, no arrhythmias... he had signif leg jerks, therefore Requip given, but he stopped & denies symptoms... ~  2/13:  Offered repeat sleep study to pt but he declines again; agrees to trial Requip for the RLS...  CHEST WALL PAIN, HX OF (ICD-V15.89)  HYPERCHOLESTEROLEMIA (ICD-272.0) - on SIMVASTATIN 40mg /d... ~  FLP 2/08 showed TChol 201, TG 58, HDL 36, LDL 158... Simvastatin 40mg /d started then. ~  FLP 4/09 showed TChol 141, TG 34, HDL 33, LDL 101... rec- same med, better diet. ~  FLP 1/10 showed TChol 115, TG 29, HDL 40, LDL 69 ~  FLP 1/11 showed TChol 122, TG 32, HDL 47, LDL 68 ~  FLP 2/12 on Simva40 showed TChol 131, TG 32, HDL 44, LDL 81 ~  FLP 2/13 on Simva40 showed TChol 149, TG 52, HDL 47, LDL 91 ~  6/13:  Pt had stopped Simva40 transiently as his wife found research indicating that the Simvastatin caused Low-T; we discussed his situation & he decided to restart his statin rx...  OBESITY (ICD-278.00) -  ~  weight 1/10 = 249#, up 9# since last OV.Marland Kitchen. ~  weight 1/11 = 246# ~  weight 2/12 = 242# ~  Weight 2/13 = 258#... We reviewed diet,  exercise, wt reduction strategies... ~  Weight 6/13 = 252#  CONSTIPATION (ICD-564.00) - on Miralax + Anusol HC Prn... Hx blood in stool & referred to GI w/ eval DrJacobs 3/10- colonoscopy showed hemorroid + 1 sm polyp= lymphoid aggregate, f/u 69yrs.  TESTOSTERONE DEFICIENCY (ICD-257.2) - he has testic atrophy on exam, but remains asymptomatic w/ good energy, drive, performance, etc & he declines replacement Rx or Urology consultation... ~  labs 3/09 showed testos level = 309 (350-890) ~  labs 1/11 showed testos level = 299 ~  labs 2/12 showed testos level = 230... he still declines replacement Rx. ~  Labs 2/13 showed Testos level = 277... He agrees to trial ANDROGEL 1%- 4pumps daily (Insurance changed to [place]). ~  6/13:  He is rec to continue the [name] & have Testos level rechecked, if not up to normal range he will need referral to Urology...  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  HEADACHE (ICD-784.0) - prev Rx w/ Topomax 100mg /d and Flexeril 10mg Prn per DrFreeman  Hx of BELLS PALSY (ICD-351.0)  RESTLESS LEG SYNDROME (ICD-333.94) - not currently on Rx- prev Requip but denied symptoms and stopped it on his own... ~  2/13: notes that leg movements annoy wife & wake him up at night; agrees to trial REQUIP 0.5mg - 1Qhs ==> incr to 2Qhs...  ANXIETY (ICD-300.00)  VITAMIN D DEFICIENCY (ICD-268.9) - on Vit D OTC 1000 u daily... ~  labs 1/10 showed Vit D level = 16... rec> OTC Vit D 01-1998 daily. ~  labs 2/12 showed Vit D level = 30... rec to continue 01-1998 u daily. ~  Labs 2/13 showed Vit D level = 38... rec to take 2000u Vit D daily supplement...  Hx of IRON DEFICIENCY (ICD-280.9) - on OTC Fe supplement daily... ~  labs 1/10 showed Hg= 14.6, MCV= 91, Fe= 37 (sat=10%) ~  labs 1/11 showed Hg= 13.5, MCV= 93, Fe= 85 (sat=26%) ~  labs 2/12 showed Hg= 14.0, MCV= 90 ~  Labs 2/13 showed Hg= 13.9, MCV= 89   Past Surgical History    Procedure Date  . Right inguinal hernia repair as a child     Outpatient Encounter Prescriptions as of 07/06/2011  Medication Sig Dispense Refill  . acyclovir (ZOVIRAX) 200 MG capsule Take 1 capsule (200 mg total) by mouth 2 (two) times daily.  60 capsule  11  . clotrimazole-betamethasone (LOTRISONE) cream Apply topically as directed. As needed for rash  30 g  5  . polyethylene glycol (MIRALAX / GLYCOLAX) packet Take  17 g by mouth daily as needed.       Marland Kitchen rOPINIRole (REQUIP) 0.5 MG tablet Take as directed  180 tablet  3  . simvastatin (ZOCOR) 40 MG tablet Take 1 tablet (40 mg total) by mouth at bedtime.  90 tablet  3  . Testosterone (FORTESTA) 10 MG/ACT (2%) GEL Place 4 application onto the skin daily.  60 g  5  . DISCONTD: acyclovir (ZOVIRAX) 200 MG capsule Take 1 capsule (200 mg total) by mouth 2 (two) times daily.  60 capsule  3  . DISCONTD: acyclovir cream (ZOVIRAX) 5 % Apply 1 application topically every 3 (three) hours as needed.      Marland Kitchen DISCONTD: acyclovir cream (ZOVIRAX) 5 % Apply 1 application topically every 3 (three) hours as needed.  15 g  11  . DISCONTD: clotrimazole-betamethasone (LOTRISONE) cream Apply topically as directed. As needed for rash  30 g  0  . DISCONTD: rOPINIRole (REQUIP) 0.5 MG tablet Take as directed  180 tablet  1  . DISCONTD: simvastatin (ZOCOR) 40 MG tablet Take 1 tablet (40 mg total) by mouth at bedtime.  90 tablet  1  . DISCONTD: Testosterone (FORTESTA) 10 MG/ACT (2%) GEL Place 4 application onto the skin daily.  60 g  5  . DISCONTD: chlorpheniramine-HYDROcodone (TUSSIONEX PENNKINETIC ER) 10-8 MG/5ML LQCR Take 5 mLs by mouth every 12 (twelve) hours as needed.  115 mL  0  . DISCONTD: Diphenhyd-Hydrocort-Nystatin (FIRST-DUKES MOUTHWASH) SUSP 5 ml gargle and swallow four times daily as needed  120 mL  1    Allergies  Allergen Reactions  . Codeine Phosphate     REACTION: nausea and vomiting    Current Medications, Allergies, Past Medical History, Past  Surgical History, Family History, and Social History were reviewed in [employer] record.   Review of Systems    The patient denies fever, chills, sweats, anorexia, fatigue, weakness, malaise, weight loss, sleep disorder, blurring, diplopia, eye irritation, eye discharge, vision loss, eye pain, photophobia, earache, ear discharge, tinnitus, decreased hearing, nasal congestion, nosebleeds, sore throat, hoarseness, chest pain, palpitations, syncope, dyspnea on exertion, orthopnea, PND, peripheral edema, cough, dyspnea at rest, excessive sputum, hemoptysis, wheezing, pleurisy, nausea, vomiting, diarrhea, constipation, change in bowel habits, abdominal pain, melena, hematochezia, jaundice, gas/bloating, indigestion/heartburn, dysphagia, odynophagia, dysuria, hematuria, urinary frequency, urinary hesitancy, nocturia, incontinence, back pain, joint pain, joint swelling, muscle cramps, muscle weakness, stiffness, arthritis, sciatica, restless legs, leg pain at night, leg pain with exertion, rash, itching, dryness, suspicious lesions, paralysis, paresthesias, seizures, tremors, vertigo, transient blindness, frequent falls, frequent headaches, difficulty walking, depression, anxiety, memory loss, confusion, cold intolerance, heat intolerance, polydipsia, polyphagia, polyuria, unusual weight change, abnormal bruising, bleeding, enlarged lymph nodes, urticaria, allergic rash, hay fever, and recurrent infections.     Objective:   Physical Exam    WD, Overweight, 46 y/o BM in NAD... GENERAL:  Alert & oriented; pleasant & cooperative... HEENT:  [place]/AT, EOM-wnl, PERRLA, EACs-clear, TMs-wnl, NOSE-clear, THROAT-clear & wnl. NECK:  Supple w/ full ROM; no JVD; normal carotid impulses w/o bruits; no thyromegaly or nodules palpated; no lymphadenopathy. CHEST:  Clear to P & A; without wheezes/ rales/ or rhonchi. HEART:  Regular Rhythm; without murmurs/ rubs/ or gallops. ABDOMEN:  Soft & nontender;  normal bowel sounds; no organomegaly or masses detected. RECTAL:  Neg - prostate 2+ & min tender w/o nodules; testicles are atrophic; stool hematest neg. EXT: without deformities or arthritic changes; no varicose veins/ venous insuffic/ or edema. NEURO:  CN's intact; motor testing  normal; sensory testing normal; gait normal & balance OK. DERM:  No lesions noted; no rash etc...  RADIOLOGY DATA:  Reviewed in the EPIC EMR & discussed w/ the patient...    >>Last CXR 2/12 showed normal heart size, clear lungs, NAD...    >>EKG 2/12 showed NSR, rate60, sl incr voltage and ?early repol...  LABORATORY DATA:  Reviewed in the EPIC EMR & discussed w/ the patient...    >>LABS 2/13 showed:  FLP- ok on Simva40;  Chems- wnl;  CBC- wnl;  TSH=1.16;  Testos=277;  VitD=38   Assessment:     OSA/ RLS>  He refuses repeat sleep study;  We reviewed the importance of weight reduction;  He does agree to trial REQUIP for the RLS...  CHOL>  On Simva40 & FLP looks good; continue diet/ exercise/ get wt down/ continue Simva40...  Obesity>  Weight reduction is the key to several of his problems...  Constip/ mild rectal stricture>  Discussed Miralax vs Senokot-S regularly...  Testos defic>  He denies symptoms but agrees top trial of ANDROGEL to see if he feels better...  Hx HSV2>  On Acyclovir suppression & uses zovirax ointment as needed...  Vit D Defic>  rec to increase his Vit D OTC supplement to 2000u daily...     Plan:     Patient's Medications  New Prescriptions   ACYCLOVIR OINTMENT (ZOVIRAX) 5 %    1 application every 3 hrs as needed  Previous Medications   POLYETHYLENE GLYCOL (MIRALAX / GLYCOLAX) PACKET    Take 17 g by mouth daily as needed.   Modified Medications   Modified Medication Previous Medication   ACYCLOVIR (ZOVIRAX) 200 MG CAPSULE acyclovir (ZOVIRAX) 200 MG capsule      Take 1 capsule (200 mg total) by mouth 2 (two) times daily.    Take 1 capsule (200 mg total) by mouth 2 (two) times daily.    CLOTRIMAZOLE-BETAMETHASONE (LOTRISONE) CREAM clotrimazole-betamethasone (LOTRISONE) cream      Apply topically as directed. As needed for rash    Apply topically as directed. As needed for rash   ROPINIROLE (REQUIP) 0.5 MG TABLET rOPINIRole (REQUIP) 0.5 MG tablet      Take as directed    Take as directed   SIMVASTATIN (ZOCOR) 40 MG TABLET simvastatin (ZOCOR) 40 MG tablet      Take 1 tablet (40 mg total) by mouth at bedtime.    Take 1 tablet (40 mg total) by mouth at bedtime.   TESTOSTERONE (FORTESTA) 10 MG/ACT (2%) GEL Testosterone (FORTESTA) 10 MG/ACT (2%) GEL      Place 4 application onto the skin daily.    Place 4 application onto the skin daily.  Discontinued Medications   ACYCLOVIR CREAM (ZOVIRAX) 5 %    Apply 1 application topically every 3 (three) hours as needed.   ACYCLOVIR CREAM (ZOVIRAX) 5 %    Apply 1 application topically every 3 (three) hours as needed.   CHLORPHENIRAMINE-HYDROCODONE (TUSSIONEX PENNKINETIC ER) 10-8 MG/5ML LQCR    Take 5 mLs by mouth every 12 (twelve) hours as needed.   DIPHENHYD-HYDROCORT-NYSTATIN (FIRST-DUKES MOUTHWASH) SUSP    5 ml gargle and swallow four times daily as needed

## 2011-07-22 ENCOUNTER — Ambulatory Visit: Payer: PRIVATE HEALTH INSURANCE | Admitting: Pulmonary Disease

## 2011-07-30 ENCOUNTER — Other Ambulatory Visit: Payer: Self-pay | Admitting: Pulmonary Disease

## 2011-09-20 ENCOUNTER — Ambulatory Visit (INDEPENDENT_AMBULATORY_CARE_PROVIDER_SITE_OTHER): Payer: PRIVATE HEALTH INSURANCE | Admitting: Adult Health

## 2011-09-20 ENCOUNTER — Encounter: Payer: Self-pay | Admitting: Adult Health

## 2011-09-20 ENCOUNTER — Telehealth: Payer: Self-pay | Admitting: Pulmonary Disease

## 2011-09-20 VITALS — BP 122/86 | HR 61 | Temp 97.4°F | Ht 71.0 in | Wt 248.8 lb

## 2011-09-20 DIAGNOSIS — H6690 Otitis media, unspecified, unspecified ear: Secondary | ICD-10-CM

## 2011-09-20 DIAGNOSIS — H6692 Otitis media, unspecified, left ear: Secondary | ICD-10-CM

## 2011-09-20 MED ORDER — NEOMYCIN-POLYMYXIN-HC 3.5-10000-1 OT SOLN: 3.0000 [drp] | Freq: Four times a day (QID) | OTIC | Status: AC

## 2011-09-20 MED ORDER — CEFDINIR 300 MG PO CAPS: 300.0000 mg | ORAL_CAPSULE | Freq: Two times a day (BID) | ORAL | Status: AC

## 2011-09-20 NOTE — Progress Notes (Signed)
Subjective:     Patient ID: Gregory Santos, male   DOB: [birth year], 46 y.o.   MRN: [id number]  HPI 46 y/o BM  ~  Jan11:  saw blood in urine yest (1st time ever), at end of stream, no pain or dysuria... known atrophic testes & low testosterone w/o symptoms, no hx kidney stones etc... we discussed checking labs (nl x low-T), urine (clear), CT Abd (neg-NAD)... otherw he's had a good yr- no other complaints or concerns...  ~  March 04, 2010:  here for yearly check up> feeling well, good energy, no new complaints or concerns...  he continues to have some sleep issues- nocturia x2, wakes tired, sleepy in eve w/TV, but refuses repeat sleep study;  Chol well controlled on Simva40, needs better diet & wt reduction;  still declines Testos replacement Rx or Urology consult;  Labs reviewed> OK x low-T & VitD=30 (rec OTC 1000u daily)...  ~  March 23, 2011:  Yearly ROV & [place] continues to feel well, good energy, no new complaints or concerns;  We reviewed his meds, checked FASTING blood work, offered Flu vaccine but he declines "I use soap & water"...    Hx OSA & RLS> still fatigued a lot & falls asleep in church; offered repeat sleep study but he refuses; rec weight loss; he has agreed to trial Requip for the RLS...    Chol> on Simva40; FLP looks good w/ TChol 149, TG 52, HDL 47, LDL 91...    Obesity> needs to do way better on diet + exercise; weight today= 258#, up 16# over the last yr; he'll join a gym he says...    Low-T> known hx atrophic testes likely from mumps orchitis; Testos level = 277 (250-890) & he won't admit to any signif Low-T symptoms stating that drive, desire, performance are all OK; he now agrees to trial of ANDROGEL 1%- 4pumps daily... LABS 2/13 showed:  FLP- ok on Simva40;  Chems- wnl;  CBC- wnl;  TSH=1.16;  Testos=277;  VitD=38  ~  July 06, 2011:  49mo ROV  & Gregory Santos has stopped his meds> Simva40 & [name] because his wife did some research & found out that Simvastatin can lower  Testosterone levels & she wanted him off it; I explained to pt that his hypogonadism is most likely the result of an old infection like Mumps and it's effect on his testicles which are atrophic and not from any of his meds; since his Chol had been quite elev in the past before medication, he is advised to stay on Statin Rx although I offered to change his med from Simvastatin to another med if he likes (he said his mother wanted him to continue his same meds & this is ultimately what he decided to do)...     We reviewed prob list, meds, xrays and labs> see below>>  09/20/2011 Acute OV  Complains of pain/irriation on left ear from temple to jaw for 1 day  Ear feels full and painful.  No drainage or change in hearing  No otc used  Going on vacation in few days  No fever       Problem List:    Hx of BRONCHITIS (ICD-490) - no recent symptoms, doing well. ~  CXR 2/12 showed normal heart size, clear lungs, NAD...  OBSTRUCTIVE SLEEP APNEA (ICD-327.23) - sleep study 7/06 w/ RDI 17, desat to 93%, mod snoring, no arrhythmias... he had signif leg jerks, therefore Requip given, but he stopped & denies symptoms... ~  2/13:  Offered repeat sleep study to pt but he declines again; agrees to trial Requip for the RLS...  CHEST WALL PAIN, HX OF (ICD-V15.89)  HYPERCHOLESTEROLEMIA (ICD-272.0) - on SIMVASTATIN 40mg /d... ~  FLP 2/08 showed TChol 201, TG 58, HDL 36, LDL 158... Simvastatin 40mg /d started then. ~  FLP 4/09 showed TChol 141, TG 34, HDL 33, LDL 101... rec- same med, better diet. ~  FLP 1/10 showed TChol 115, TG 29, HDL 40, LDL 69 ~  FLP 1/11 showed TChol 122, TG 32, HDL 47, LDL 68 ~  FLP 2/12 on Simva40 showed TChol 131, TG 32, HDL 44, LDL 81 ~  FLP 2/13 on Simva40 showed TChol 149, TG 52, HDL 47, LDL 91 ~  6/13:  Pt had stopped Simva40 transiently as his wife found research indicating that the Simvastatin caused Low-T; we discussed his situation & he decided to restart his statin rx...  OBESITY  (ICD-278.00) -  ~  weight 1/10 = 249#, up 9# since last OV.Marland Kitchen. ~  weight 1/11 = 246# ~  weight 2/12 = 242# ~  Weight 2/13 = 258#... We reviewed diet, exercise, wt reduction strategies... ~  Weight 6/13 = 252#  CONSTIPATION (ICD-564.00) - on Miralax + Anusol HC Prn... Hx blood in stool & referred to GI w/ eval DrJacobs 3/10- colonoscopy showed hemorroid + 1 sm polyp= lymphoid aggregate, f/u 12yrs.  TESTOSTERONE DEFICIENCY (ICD-257.2) - he has testic atrophy on exam, but remains asymptomatic w/ good energy, drive, performance, etc & he declines replacement Rx or Urology consultation... ~  labs 3/09 showed testos level = 309 (350-890) ~  labs 1/11 showed testos level = 299 ~  labs 2/12 showed testos level = 230... he still declines replacement Rx. ~  Labs 2/13 showed Testos level = 277... He agrees to trial ANDROGEL 1%- 4pumps daily (Insurance changed to [place]). ~  6/13:  He is rec to continue the [name] & have Testos level rechecked, if not up to normal range he will need referral to Urology...  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  HEADACHE (ICD-784.0) - prev Rx w/ Topomax 100mg /d and Flexeril 10mg Prn per DrFreeman  Hx of BELLS PALSY (ICD-351.0)  RESTLESS LEG SYNDROME (ICD-333.94) - not currently on Rx- prev Requip but denied symptoms and stopped it on his own... ~  2/13: notes that leg movements annoy wife & wake him up at night; agrees to trial REQUIP 0.5mg - 1Qhs ==> incr to 2Qhs...  ANXIETY (ICD-300.00)  VITAMIN D DEFICIENCY (ICD-268.9) - on Vit D OTC 1000 u daily... ~  labs 1/10 showed Vit D level = 16... rec> OTC Vit D 01-1998 daily. ~  labs 2/12 showed Vit D level = 30... rec to continue 01-1998 u daily. ~  Labs 2/13 showed Vit D level = 38... rec to take 2000u Vit D daily supplement...  Hx of IRON DEFICIENCY (ICD-280.9) - on OTC Fe supplement daily... ~  labs 1/10 showed Hg= 14.6, MCV= 91, Fe= 37 (sat=10%) ~  labs 1/11  showed Hg= 13.5, MCV= 93, Fe= 85 (sat=26%) ~  labs 2/12 showed Hg= 14.0, MCV= 90 ~  Labs 2/13 showed Hg= 13.9, MCV= 89   Past Surgical History  Procedure Date  . Right inguinal hernia repair as a child     Outpatient Encounter Prescriptions as of 09/20/2011  Medication Sig Dispense Refill  . acyclovir (ZOVIRAX) 200 MG capsule Take 1 capsule (200 mg total) by mouth 2 (two)  times daily.  60 capsule  11  . acyclovir ointment (ZOVIRAX) 5 % 1 application every 3 hrs as needed  15 g  11  . clotrimazole-betamethasone (LOTRISONE) cream APPLY TO RASH AS NEEDED  45 g  11  . polyethylene glycol (MIRALAX / GLYCOLAX) packet Take 17 g by mouth daily as needed.       Marland Kitchen PROCTOSOL HC 2.5 % rectal cream APPLY RECTALLY 2 (TWO) TIMES DAILY.  28.35 g  11  . rOPINIRole (REQUIP) 0.5 MG tablet Take as directed  180 tablet  3  . simvastatin (ZOCOR) 40 MG tablet Take 1 tablet (40 mg total) by mouth at bedtime.  90 tablet  3  . Testosterone (FORTESTA) 10 MG/ACT (2%) GEL Place 4 application onto the skin daily.  60 g  5    Allergies  Allergen Reactions  . Codeine Phosphate     REACTION: nausea and vomiting    Current Medications, Allergies, Past Medical History, Past Surgical History, Family History, and Social History were reviewed in [employer] record.   Review of Systems    Constitutional:   No  weight loss, night sweats,  Fevers, chills, fatigue, or  lassitude.  HEENT:   No headaches,  Difficulty swallowing,  Tooth/dental problems, or  Sore throat,                No sneezing, itching,   nasal congestion, post nasal drip,   CV:  No chest pain,  Orthopnea, PND, swelling in lower extremities, anasarca, dizziness, palpitations, syncope.   GI  No heartburn, indigestion, abdominal pain, nausea, vomiting, diarrhea, change in bowel habits, loss of appetite, bloody stools.   Resp: No shortness of breath with exertion or at rest.  No excess mucus, no productive cough,  No  non-productive cough,  No coughing up of blood.  No change in color of mucus.  No wheezing.  No chest wall deformity  Skin: no rash or lesions.  GU: no dysuria, change in color of urine, no urgency or frequency.  No flank pain, no hematuria   MS:  No joint pain or swelling.  No decreased range of motion.  No back pain.  Psych:  No change in mood or affect. No depression or anxiety.  No memory loss.       Objective:   Physical Exam    WD, Overweight, 46 y/o BM in NAD... GENERAL:  Alert & oriented; pleasant & cooperative... HEENT:  [place]/AT, , left TM swollen /red  NOSE-clear, THROAT-clear & wnl., poor dentition  NECK:  Supple w/ full ROM; no JVD; normal carotid impulses w/o bruits; no thyromegaly or nodules palpated; no lymphadenopathy. CHEST:  Clear to P & A; without wheezes/ rales/ or rhonchi. HEART:  Regular Rhythm; without murmurs/ rubs/ or gallops. ABDOMEN:  Soft & nontender; normal bowel sounds; no organomegaly or masses detected.  EXT: without deformities or arthritic changes; no varicose veins/ venous insuffic/ or edema. NEURO:   ; gait normal & balance OK. DERM:  No lesions noted; no rash etc...    Assessment:

## 2011-09-20 NOTE — Assessment & Plan Note (Signed)
Omnicef 300mg   Twice daily  For 7 days  Cortisporin Otic drops -3 drops to left ear Four times a day  For 7 days  Please contact office for sooner follow up if symptoms do not improve or worsen or seek emergency care

## 2011-09-20 NOTE — Telephone Encounter (Signed)
Pt c/o irritation / pain in left ear from temple down to jaw area.  Denies fever or sinus symptoms.  Pt given appt with Tammy Parrett today at 9:30.

## 2011-09-20 NOTE — Patient Instructions (Addendum)
Omnicef 300mg  Twice daily  For 7 days  Cortisporin Otic drops -3 drops to left ear Four times a day  For 7 days  Please contact office for sooner follow up if symptoms do not improve or worsen or seek emergency care  

## 2011-11-02 ENCOUNTER — Other Ambulatory Visit: Payer: Self-pay | Admitting: Pulmonary Disease

## 2011-11-09 ENCOUNTER — Other Ambulatory Visit: Payer: Self-pay | Admitting: Pulmonary Disease

## 2011-11-28 ENCOUNTER — Other Ambulatory Visit: Payer: Self-pay | Admitting: Pulmonary Disease

## 2011-12-07 ENCOUNTER — Telehealth: Payer: Self-pay | Admitting: Pulmonary Disease

## 2011-12-07 MED ORDER — AZITHROMYCIN 250 MG PO TABS: ORAL_TABLET | ORAL | Status: DC

## 2011-12-07 MED ORDER — FIRST-DUKES MOUTHWASH MT SUSP: OROMUCOSAL | Status: DC

## 2011-12-07 NOTE — Telephone Encounter (Signed)
Called, spoke with pt.  Informed him of below per Dr. Kriste Basque.  He is aware rxs sent to [facility] and to call back if symptoms do not improve or worsen.  He verbalized understanding.

## 2011-12-07 NOTE — Telephone Encounter (Signed)
Per SN---ok to call in zpak #1  Take as directed with no refills and MMW  #4 oz  1 tsp gargle and swallow four times daily as needed and can give 1 refill.

## 2011-12-07 NOTE — Telephone Encounter (Signed)
Pt c/o scratchy throat and sore throat x 2 days ( began with cold weather change)  Pt requesting Zpak and Magic Mouth Wash be called into his pharm [facility] [address]  Pt states okay to leave detailed msg on machine.  Please advise Dr Kriste Basque. Thanks  Allergies  Allergen Reactions  . Codeine Phosphate     REACTION: nausea and vomiting

## 2011-12-10 ENCOUNTER — Telehealth: Payer: Self-pay | Admitting: Pulmonary Disease

## 2011-12-10 MED ORDER — HYDROCODONE-HOMATROPINE 5-1.5 MG/5ML PO SYRP: ORAL_SOLUTION | ORAL | Status: DC

## 2011-12-10 NOTE — Telephone Encounter (Signed)
Hydromet 1-2 tsp every 6hr As needed  #8 oz  No refills  May cause sedation  Make sure he can take as he has codeine allergy.  Please contact office for sooner follow up if symptoms do not improve or worsen or seek emergency care

## 2011-12-10 NOTE — Telephone Encounter (Signed)
I spoke with pt and he stated he is able to take the hydromet bc it is not a large amount of codeine in this medication. I advised pt can cause sedation. He voiced his understanding. I have called RX into the pharmacy

## 2011-12-10 NOTE — Telephone Encounter (Signed)
Called and spoke with pts wife and she stated that the pt was given zpak and mmw on 11/12 and pt is not better.  His cough is worse but not getting anything up with the cough.  Pt is requesting cough meds to be sent in to the pharmacy.  SN is not in the office today.  Will forward to doc of the day for recs.  Please advise thanks   Last ov--06/2011 Next ov--01/2012  Allergies  Allergen Reactions  . Codeine Phosphate     REACTION: nausea and vomiting

## 2011-12-10 NOTE — Telephone Encounter (Signed)
I spoke with pt and he is requesting to have cough syrup called in ASAP. I spoke with JJ and was advised okay to send to TP instead. Please advise thanks

## 2011-12-10 NOTE — Telephone Encounter (Signed)
Pt calling again in ref to previous msg can be reached at [phone].Gregory Santos

## 2012-01-24 ENCOUNTER — Telehealth: Payer: Self-pay | Admitting: Pulmonary Disease

## 2012-01-24 MED ORDER — FIRST-DUKES MOUTHWASH MT SUSP: OROMUCOSAL | Status: DC

## 2012-01-24 MED ORDER — AZITHROMYCIN 250 MG PO TABS: ORAL_TABLET | ORAL | Status: DC

## 2012-01-24 NOTE — Telephone Encounter (Signed)
Ok for zpak #1 as directed and MMW 4 oz 1 tsp qid swish and swallow with 1 refill

## 2012-01-24 NOTE — Telephone Encounter (Signed)
Spoke with pt He is c/o scratchy throat and some PND x 2 days He reports no other symptoms and states "I know I'm getting sick and need to knock this out" Pt is req rx for MMW and z pack  Please advise, thanks! Last ov 09/20/11 Next ov 02/03/12 Allergies  Allergen Reactions  . Codeine Phosphate     REACTION: nausea and vomiting

## 2012-01-24 NOTE — Telephone Encounter (Signed)
Called spoke with patient, advised SN okayed for the zpak and MMW.  Rx's sent to verified pharmacy.  Pt aware to call if his symptoms do not improve or worsen.

## 2012-02-03 ENCOUNTER — Ambulatory Visit: Payer: PRIVATE HEALTH INSURANCE | Admitting: Pulmonary Disease

## 2012-02-07 ENCOUNTER — Telehealth: Payer: Self-pay | Admitting: Pulmonary Disease

## 2012-02-07 MED ORDER — MOXIFLOXACIN HCL 400 MG PO TABS: 400.0000 mg | ORAL_TABLET | Freq: Every day | ORAL | Status: DC

## 2012-02-07 NOTE — Telephone Encounter (Signed)
Per SN----we have no openings this week.     avelox 400 mg #7  1 daily.   Rest at home,  Increase fluids, mucinex 2 po bid and take the tylenol as needed.  Pt voiced his understanding and nothing further is needed.

## 2012-02-07 NOTE — Telephone Encounter (Signed)
Spoke with pt states he is having body aches,sinus drainage,chest congestion,productive cough  Offered him an appt with TD pt only wants to see SN.... Allergies  Allergen Reactions  . Codeine Phosphate     REACTION: nausea and vomiting   Dr Kriste Basque please advise  Thank you

## 2012-03-15 ENCOUNTER — Other Ambulatory Visit (INDEPENDENT_AMBULATORY_CARE_PROVIDER_SITE_OTHER): Payer: PRIVATE HEALTH INSURANCE

## 2012-03-15 ENCOUNTER — Encounter: Payer: Self-pay | Admitting: Pulmonary Disease

## 2012-03-15 ENCOUNTER — Ambulatory Visit (INDEPENDENT_AMBULATORY_CARE_PROVIDER_SITE_OTHER): Payer: PRIVATE HEALTH INSURANCE | Admitting: Pulmonary Disease

## 2012-03-15 ENCOUNTER — Ambulatory Visit (INDEPENDENT_AMBULATORY_CARE_PROVIDER_SITE_OTHER)
Admission: RE | Admit: 2012-03-15 | Discharge: 2012-03-15 | Disposition: A | Discharge status: Home / Self Care | Payer: PRIVATE HEALTH INSURANCE | Source: Ambulatory Visit | Attending: Pulmonary Disease | Admitting: Pulmonary Disease

## 2012-03-15 VITALS — BP 128/86 | HR 65 | Temp 97.0°F | Ht 71.0 in | Wt 252.0 lb

## 2012-03-15 DIAGNOSIS — R05 Cough: Secondary | ICD-10-CM | POA: Insufficient documentation

## 2012-03-15 DIAGNOSIS — R0600 Dyspnea, unspecified: Secondary | ICD-10-CM

## 2012-03-15 DIAGNOSIS — F411 Generalized anxiety disorder: Secondary | ICD-10-CM

## 2012-03-15 DIAGNOSIS — E7800 Pure hypercholesterolemia, unspecified: Secondary | ICD-10-CM

## 2012-03-15 DIAGNOSIS — R0689 Other abnormalities of breathing: Secondary | ICD-10-CM

## 2012-03-15 DIAGNOSIS — R059 Cough, unspecified: Secondary | ICD-10-CM

## 2012-03-15 DIAGNOSIS — J387 Other diseases of larynx: Secondary | ICD-10-CM

## 2012-03-15 DIAGNOSIS — G2581 Restless legs syndrome: Secondary | ICD-10-CM

## 2012-03-15 DIAGNOSIS — E291 Testicular hypofunction: Secondary | ICD-10-CM

## 2012-03-15 DIAGNOSIS — R0609 Other forms of dyspnea: Secondary | ICD-10-CM

## 2012-03-15 DIAGNOSIS — R0989 Other specified symptoms and signs involving the circulatory and respiratory systems: Secondary | ICD-10-CM

## 2012-03-15 DIAGNOSIS — R058 Other specified cough: Secondary | ICD-10-CM | POA: Insufficient documentation

## 2012-03-15 DIAGNOSIS — E669 Obesity, unspecified: Secondary | ICD-10-CM

## 2012-03-15 DIAGNOSIS — R51 Headache: Secondary | ICD-10-CM

## 2012-03-15 DIAGNOSIS — K219 Gastro-esophageal reflux disease without esophagitis: Secondary | ICD-10-CM

## 2012-03-15 LAB — HEPATIC FUNCTION PANEL
ALT: 18 U/L (ref 0–53)
AST: 20 U/L (ref 0–37)
Alkaline Phosphatase: 54 U/L (ref 39–117)
Bilirubin, Direct: 0.1 mg/dL (ref 0.0–0.3)
Total Bilirubin: 0.5 mg/dL (ref 0.3–1.2)

## 2012-03-15 LAB — CBC WITH DIFFERENTIAL/PLATELET
Basophils Absolute: 0 10*3/uL (ref 0.0–0.1)
Eosinophils Absolute: 0.2 10*3/uL (ref 0.0–0.7)
HCT: 43 % (ref 39.0–52.0)
Lymphs Abs: 2.4 10*3/uL (ref 0.7–4.0)
MCHC: 33.4 g/dL (ref 30.0–36.0)
MCV: 88.2 fl (ref 78.0–100.0)
Monocytes Absolute: 0.3 10*3/uL (ref 0.1–1.0)
Neutrophils Relative %: 64.7 % (ref 43.0–77.0)
Platelets: 354 10*3/uL (ref 150.0–400.0)
RDW: 13.7 % (ref 11.5–14.6)
WBC: 8.4 10*3/uL (ref 4.5–10.5)

## 2012-03-15 LAB — BASIC METABOLIC PANEL
BUN: 10 mg/dL (ref 6–23)
Chloride: 104 mEq/L (ref 96–112)
GFR: 128.87 mL/min (ref >60.00)
Potassium: 3.9 mEq/L (ref 3.5–5.1)
Sodium: 137 mEq/L (ref 135–145)

## 2012-03-15 LAB — LIPID PANEL
Cholesterol: 124 mg/dL (ref 0–200)
LDL Cholesterol: 82 mg/dL (ref 0–99)

## 2012-03-15 LAB — TSH: TSH: 1.18 u[IU]/mL (ref 0.35–5.50)

## 2012-03-15 MED ORDER — OMEPRAZOLE 40 MG PO CPDR: 40.0000 mg | DELAYED_RELEASE_CAPSULE | Freq: Every day | ORAL | Status: DC

## 2012-03-15 MED ORDER — ACYCLOVIR 5 % EX OINT: TOPICAL_OINTMENT | CUTANEOUS | Status: DC

## 2012-03-15 MED ORDER — ACYCLOVIR 200 MG PO CAPS: 200.0000 mg | ORAL_CAPSULE | Freq: Two times a day (BID) | ORAL | Status: DC

## 2012-03-15 MED ORDER — HYDROCOD POLST-CHLORPHEN POLST 10-8 MG/5ML PO LQCR: 5.0000 mL | Freq: Two times a day (BID) | ORAL | Status: DC

## 2012-03-15 NOTE — Patient Instructions (Addendum)
Today we updated your med list in our EPIC system...    Continue your current medications the same...    We refilled your meds for 2014...    And we wrote a prescription for TUSSIONEX to use every 12H as needed for cough...  For your nocturnnal cough & throat symptoms>>    Take the new OMEPRAZOLE 40mg - one tab about 30 min before the evening meal...    Elevate the head of your bed on 6 inch blocks...    Don't eat or drink much after dinner so your stomach can thoroughly empty after the evening meal...  Today we did a breathing test, a follow up CXR, and FASTING blood work...    We will notify you of the results when avail...  Call for any questions...  Let's plan a recheck in about 6 months time.Marland KitchenMarland Kitchen

## 2012-03-15 NOTE — Progress Notes (Signed)
Subjective:     Patient ID: Gregory Santos, male   DOB: 01-Jun-1965, 47 y.o.   MRN: [id number]  HPI 47 y/o BM here for a f/u visit... he has several med problems as listed below...   ~  March 23, 2011:  Yearly ROV & Gregory Santos continues to feel well, good energy, no new complaints or concerns;  We reviewed his meds, checked FASTING blood work, offered Flu vaccine but he declines "I use soap & water"...    Hx OSA & RLS> still fatigued a lot & falls asleep in church; offered repeat sleep study but he refuses; rec weight loss; he has agreed to trial Requip for the RLS...    Chol> on Simva40; FLP looks good w/ TChol 149, TG 52, HDL 47, LDL 91...    Obesity> needs to do way better on diet + exercise; weight today= 258#, up 16# over the last yr; he'll join a gym he says...    Low-T> known hx atrophic testes likely from mumps orchitis; Testos level = 277 (250-890) & he won't admit to any signif Low-T symptoms stating that drive, desire, performance are all OK; he now agrees to trial of ANDROGEL 1%- 4pumps daily... LABS 2/13 showed:  FLP- ok on Simva40;  Chems- wnl;  CBC- wnl;  TSH=1.16;  Testos=277;  VitD=38  ~  July 06, 2011:  23mo ROV  & Gregory Santos has stopped his meds> Simva40 & Gregory Santos because his wife did some research & found out that Simvastatin can lower Testosterone levels & she wanted him off it; I explained to pt that his hypogonadism is most likely the result of an old infection like Mumps and it's effect on his testicles which are atrophic and not from any of his meds; since his Chol had been quite elev in the past before medication, he is advised to stay on Statin Rx although I offered to change his med from Simvastatin to another med if he likes (he said his mother wanted him to continue his same meds & this is ultimately what he decided to do)...     We reviewed prob list, meds, xrays and labs> see below>>  ~  March 15, 2012:  27mo ROV & Gregory Santos is c/o a cough- comes & goes, mostly dry cough & he  says that Hydromet called in does not really help, feels like phlegm gets stuck in his throat, notes some SOB w/ the cough he says- we decided to check CXR (clear), PFT (wnl), LABS (all wnl);  Rx w/ Antireflux regimen, Omep40, Tussionex...     Hx OSA & RLS> sleep study 2006 w/ RDI=17 & mod snoring w/ signif leg jerks; never tried CPAP, has Requip 0.5mg  for RLS but only uses it "prn"...    Chol> on Simva40 & tol well; FLP looks good w/ TChol 124, TG 34, HDL 36, LDL 82...    Obesity> needs to do better on diet + exercise; weight today= 252#, no change, he'll join a gym he says...    GI- r/oLPR, polyp, constip> on antireflux regimen, Omep40, Miralax, AnusolHC; we reviewed antireflux regimen, last colon was 2010 w/ hem & 1 sm benign polyp- f/u 66yrs.    Low-T> known hx atrophic testes likely from mumps orchitis; on Fortesta- 4pumps/d & he notes that energy & mojo are OK!    Hx VitD & Iron defic> supposed to take VitD supplement ~2000u daily... We reviewed prob list, meds, xrays and labs> see below for updates >>  CXR 2/14 showed  heart at upper lim of norm, clear lungs, NAD... PFT 2/14 showed FVC=3.59 (81%), FEV1=2.95 (83%), %1sec=82, mid-flows=88% predicted... LABS 2/14:  FLP- at goals on Simva40;  Chems- wnl & BS=106;  CBC- wnl;  TSH=1.18           Problem List:    Hx of BRONCHITIS (ICD-490) - no recent symptoms, doing well. ~  CXR 2/12 showed normal heart size, clear lungs, NAD.Marland Kitchen. ~  CXR 2/14 showed heart at upper lim of norm, clear lungs, NAD  OBSTRUCTIVE SLEEP APNEA (ICD-327.23) - sleep study 7/06 w/ RDI 17, desat to 93%, mod snoring, no arrhythmias... he had signif leg jerks, therefore Requip given, but he stopped & denies symptoms... ~  2/13: Offered repeat sleep study to pt but he declines again; agrees to trial Requip for the RLS... ~  2/14: sleep study 2006 w/ RDI=17 & mod snoring w/ signif leg jerks; never tried CPAP, has Requip 0.5mg  for RLS but only uses it "prn"  CHEST WALL PAIN, HX  OF (ICD-V15.89)  HYPERCHOLESTEROLEMIA (ICD-272.0) - on SIMVASTATIN 40mg /d... ~  FLP 2/08 showed TChol 201, TG 58, HDL 36, LDL 158... Simvastatin 40mg /d started then. ~  FLP 4/09 showed TChol 141, TG 34, HDL 33, LDL 101... rec- same med, better diet. ~  FLP 1/10 showed TChol 115, TG 29, HDL 40, LDL 69 ~  FLP 1/11 showed TChol 122, TG 32, HDL 47, LDL 68 ~  FLP 2/12 on Simva40 showed TChol 131, TG 32, HDL 44, LDL 81 ~  FLP 2/13 on Simva40 showed TChol 149, TG 52, HDL 47, LDL 91 ~  6/13:  Pt had stopped Simva40 transiently as his wife found research indicating that the Simvastatin caused Low-T; we discussed his situation & he decided to restart his statin rx... ~  FLP 2/14 on Simva40 showed TChol 124, TG 34, HDL 36, LDL 82  OBESITY (ICD-278.00) -  ~  weight 1/10 = 249#, up 9# since last OV.Marland Kitchen. ~  weight 1/11 = 246# ~  weight 2/12 = 242# ~  Weight 2/13 = 258#... We reviewed diet, exercise, wt reduction strategies... ~  Weight 6/13 = 252# ~  Weight 2/14 = 252#  CONSTIPATION (ICD-564.00) - on Miralax + Anusol HC Prn... Hx blood in stool & referred to GI w/ eval Gregory Santos 3/10- colonoscopy showed hemorroid + 1 sm polyp= lymphoid aggregate, f/u 77yrs.  TESTOSTERONE DEFICIENCY (ICD-257.2) - he has testic atrophy on exam, but remains asymptomatic w/ good energy, drive, performance, etc & he declines replacement Rx or Urology consultation... ~  labs 3/09 showed testos level = 309 (350-890) ~  labs 1/11 showed testos level = 299 ~  labs 2/12 showed testos level = 230... he still declines replacement Rx. ~  Labs 2/13 showed Testos level = 277... He agrees to trial ANDROGEL 1%- 4pumps daily (Insurance changed to [place]). ~  6/13: He is rec to continue the [name] & have Testos level rechecked, if not up to normal range he will need referral to Urology... ~  2/14: known hx atrophic testes likely from mumps orchitis; on Fortesta- 4pumps/d & he notes that energy & mojo are OK! He has not had f/u testos  levels on Rx...  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  HEADACHE (ICD-784.0) - prev Rx w/ Topomax 100mg /d and Flexeril 10mg Prn per DrFreeman  Hx of BELLS PALSY (ICD-351.0)  RESTLESS LEG SYNDROME (ICD-333.94) - not currently on Rx- prev Requip but denied  symptoms and stopped it on his own... ~  2/13: notes that leg movements annoy wife & wake him up at night; agrees to trial REQUIP 0.5mg - 1Qhs ==> incr to 2Qhs...  ANXIETY (ICD-300.00)  VITAMIN D DEFICIENCY (ICD-268.9) - on Vit D OTC 1000 u daily... ~  labs 1/10 showed Vit D level = 16... rec> OTC Vit D 01-1998 daily. ~  labs 2/12 showed Vit D level = 30... rec to continue 01-1998 u daily. ~  Labs 2/13 showed Vit D level = 38... rec to take 2000u Vit D daily supplement... ~  2/14: he is reminded to stay on the Men's formula MVI & VitD supplement daily...  Hx of IRON DEFICIENCY (ICD-280.9) - on OTC Fe supplement daily... ~  labs 1/10 showed Hg= 14.6, MCV= 91, Fe= 37 (sat=10%) ~  labs 1/11 showed Hg= 13.5, MCV= 93, Fe= 85 (sat=26%) ~  labs 2/12 showed Hg= 14.0, MCV= 90 ~  Labs 2/13 showed Hg= 13.9, MCV= 89 ~  Labs 2/14 showed Hg= 14.4   Past Surgical History  Procedure Laterality Date  . Right inguinal hernia repair as a child      Outpatient Encounter Prescriptions as of 03/15/2012  Medication Sig Dispense Refill  . acyclovir (ZOVIRAX) 200 MG capsule Take 1 capsule (200 mg total) by mouth 2 (two) times daily.  60 capsule  11  . acyclovir ointment (ZOVIRAX) 5 % 1 application every 3 hrs as needed  15 g  11  . clotrimazole-betamethasone (LOTRISONE) cream APPLY TO RASH AS NEEDED  45 g  11  . Diphenhyd-Hydrocort-Nystatin (FIRST-DUKES MOUTHWASH) SUSP 1 tsp gargle and swallow four times daily as needed  120 mL  1  . FORTESTA 10 MG/ACT (2%) GEL PLACE 4 APPLICATIONS ONTO THE SKIN DAILY AS DIRECTED  60 g  3  . HYDROcodone-homatropine (HYDROMET) 5-1.5 MG/5ML syrup 1-2 tsp every 6 hours as  needed. May cause sedation  140 mL  0  . polyethylene glycol (MIRALAX / GLYCOLAX) packet Take 17 g by mouth daily as needed.       Marland Kitchen PROCTOSOL HC 2.5 % rectal cream APPLY RECTALLY 2 (TWO) TIMES DAILY.  28.35 g  11  . rOPINIRole (REQUIP) 0.5 MG tablet Take as directed  180 tablet  3  . simvastatin (ZOCOR) 40 MG tablet TAKE 1 TABLET BY MOUTH AT BEDTIME  90 tablet  1  . [DISCONTINUED] azithromycin (ZITHROMAX) 250 MG tablet Take as directed  6 each  0  . [DISCONTINUED] moxifloxacin (AVELOX) 400 MG tablet Take 1 tablet (400 mg total) by mouth daily.  7 tablet  0  . [DISCONTINUED] PROCTOSOL HC 2.5 % rectal cream PLACE RECTALLY TWICE DAILY AS DIRECTED  28.35 g  4   No facility-administered encounter medications on file as of 03/15/2012.    Allergies  Allergen Reactions  . Codeine Phosphate     REACTION: nausea and vomiting    Current Medications, Allergies, Past Medical History, Past Surgical History, Family History, and Social History were reviewed in [employer] record.   Review of Systems    The patient denies fever, chills, sweats, anorexia, fatigue, weakness, malaise, weight loss, sleep disorder, blurring, diplopia, eye irritation, eye discharge, vision loss, eye pain, photophobia, earache, ear discharge, tinnitus, decreased hearing, nasal congestion, nosebleeds, sore throat, hoarseness, chest pain, palpitations, syncope, dyspnea on exertion, orthopnea, PND, peripheral edema, cough, dyspnea at rest, excessive sputum, hemoptysis, wheezing, pleurisy, nausea, vomiting, diarrhea, constipation, change in bowel habits, abdominal pain, melena, hematochezia, jaundice, gas/bloating, indigestion/heartburn,  dysphagia, odynophagia, dysuria, hematuria, urinary frequency, urinary hesitancy, nocturia, incontinence, back pain, joint pain, joint swelling, muscle cramps, muscle weakness, stiffness, arthritis, sciatica, restless legs, leg pain at night, leg pain with exertion, rash, itching,  dryness, suspicious lesions, paralysis, paresthesias, seizures, tremors, vertigo, transient blindness, frequent falls, frequent headaches, difficulty walking, depression, anxiety, memory loss, confusion, cold intolerance, heat intolerance, polydipsia, polyphagia, polyuria, unusual weight change, abnormal bruising, bleeding, enlarged lymph nodes, urticaria, allergic rash, hay fever, and recurrent infections.     Objective:   Physical Exam    WD, Overweight, 46 y/o BM in NAD... GENERAL:  Alert & oriented; pleasant & cooperative... HEENT:  [place]/AT, EOM-wnl, PERRLA, EACs-clear, TMs-wnl, NOSE-clear, THROAT-clear & wnl. NECK:  Supple w/ full ROM; no JVD; normal carotid impulses w/o bruits; no thyromegaly or nodules palpated; no lymphadenopathy. CHEST:  Clear to P & A; without wheezes/ rales/ or rhonchi. HEART:  Regular Rhythm; without murmurs/ rubs/ or gallops. ABDOMEN:  Soft & nontender; normal bowel sounds; no organomegaly or masses detected. RECTAL:  Neg - prostate 2+ & min tender w/o nodules; testicles are atrophic; stool hematest neg. EXT: without deformities or arthritic changes; no varicose veins/ venous insuffic/ or edema. NEURO:  CN's intact; motor testing normal; sensory testing normal; gait normal & balance OK. DERM:  No lesions noted; no rash etc...  RADIOLOGY DATA:  Reviewed in the EPIC EMR & discussed w/ the patient...  LABORATORY DATA:  Reviewed in the EPIC EMR & discussed w/ the patient...   Assessment:      OSA/ RLS>  He refuses repeat sleep study;  We reviewed the importance of weight reduction;  He uses REQUIP for the RLS as needed he says...  CHOL>  On Simva40 & FLP looks good; continue diet/ exercise/ get wt down/ continue Simva40...  Obesity>  Weight reduction is the key to several of his problems...  Constip/ mild rectal stricture>  Discussed Miralax vs Senokot-S regularly...  Testos defic>  He denies symptoms & has remained on the [place]- 4 pumps daily...  Hx  HSV2>  On Acyclovir suppression & uses zovirax ointment as needed...  Vit D Defic>  rec to restart his Vit D OTC supplement to 2000u daily...     Plan:     Patient's Medications  New Prescriptions   CHLORPHENIRAMINE-HYDROCODONE (TUSSIONEX PENNKINETIC ER) 10-8 MG/5ML LQCR    Take 5 mLs by mouth every 12 (twelve) hours.   OMEPRAZOLE (PRILOSEC) 40 MG CAPSULE    Take 1 capsule (40 mg total) by mouth daily. Take 30 minutes before dinner  Previous Medications   CLOTRIMAZOLE-BETAMETHASONE (LOTRISONE) CREAM    APPLY TO RASH AS NEEDED   DIPHENHYD-HYDROCORT-NYSTATIN (FIRST-DUKES MOUTHWASH) SUSP    1 tsp gargle and swallow four times daily as needed   FORTESTA 10 MG/ACT (2%) GEL    PLACE 4 APPLICATIONS ONTO THE SKIN DAILY AS DIRECTED   HYDROCODONE-HOMATROPINE (HYDROMET) 5-1.5 MG/5ML SYRUP    1-2 tsp every 6 hours as needed. May cause sedation   POLYETHYLENE GLYCOL (MIRALAX / GLYCOLAX) PACKET    Take 17 g by mouth daily as needed.    PROCTOSOL HC 2.5 % RECTAL CREAM    APPLY RECTALLY 2 (TWO) TIMES DAILY.   ROPINIROLE (REQUIP) 0.5 MG TABLET    Take as directed   SIMVASTATIN (ZOCOR) 40 MG TABLET    TAKE 1 TABLET BY MOUTH AT BEDTIME  Modified Medications   Modified Medication Previous Medication   ACYCLOVIR (ZOVIRAX) 200 MG CAPSULE acyclovir (ZOVIRAX) 200 MG capsule  Take 1 capsule (200 mg total) by mouth 2 (two) times daily.    Take 1 capsule (200 mg total) by mouth 2 (two) times daily.   ACYCLOVIR OINTMENT (ZOVIRAX) 5 % acyclovir ointment (ZOVIRAX) 5 %      1 application every 3 hrs as needed    1 application every 3 hrs as needed  Discontinued Medications   AZITHROMYCIN (ZITHROMAX) 250 MG TABLET    Take as directed   MOXIFLOXACIN (AVELOX) 400 MG TABLET    Take 1 tablet (400 mg total) by mouth daily.   PROCTOSOL HC 2.5 % RECTAL CREAM    PLACE RECTALLY TWICE DAILY AS DIRECTED

## 2012-04-26 ENCOUNTER — Other Ambulatory Visit: Payer: Self-pay | Admitting: Pulmonary Disease

## 2012-05-16 IMAGING — CR DG CHEST 2V
2 series · 2 of 2 positions shown · non-contrast
Comparison: 05/25/2007.

CLINICAL DATA: Nonsmoker.  Physical examination.

CHEST - 2 VIEW

[view not recorded (1 of 2)]
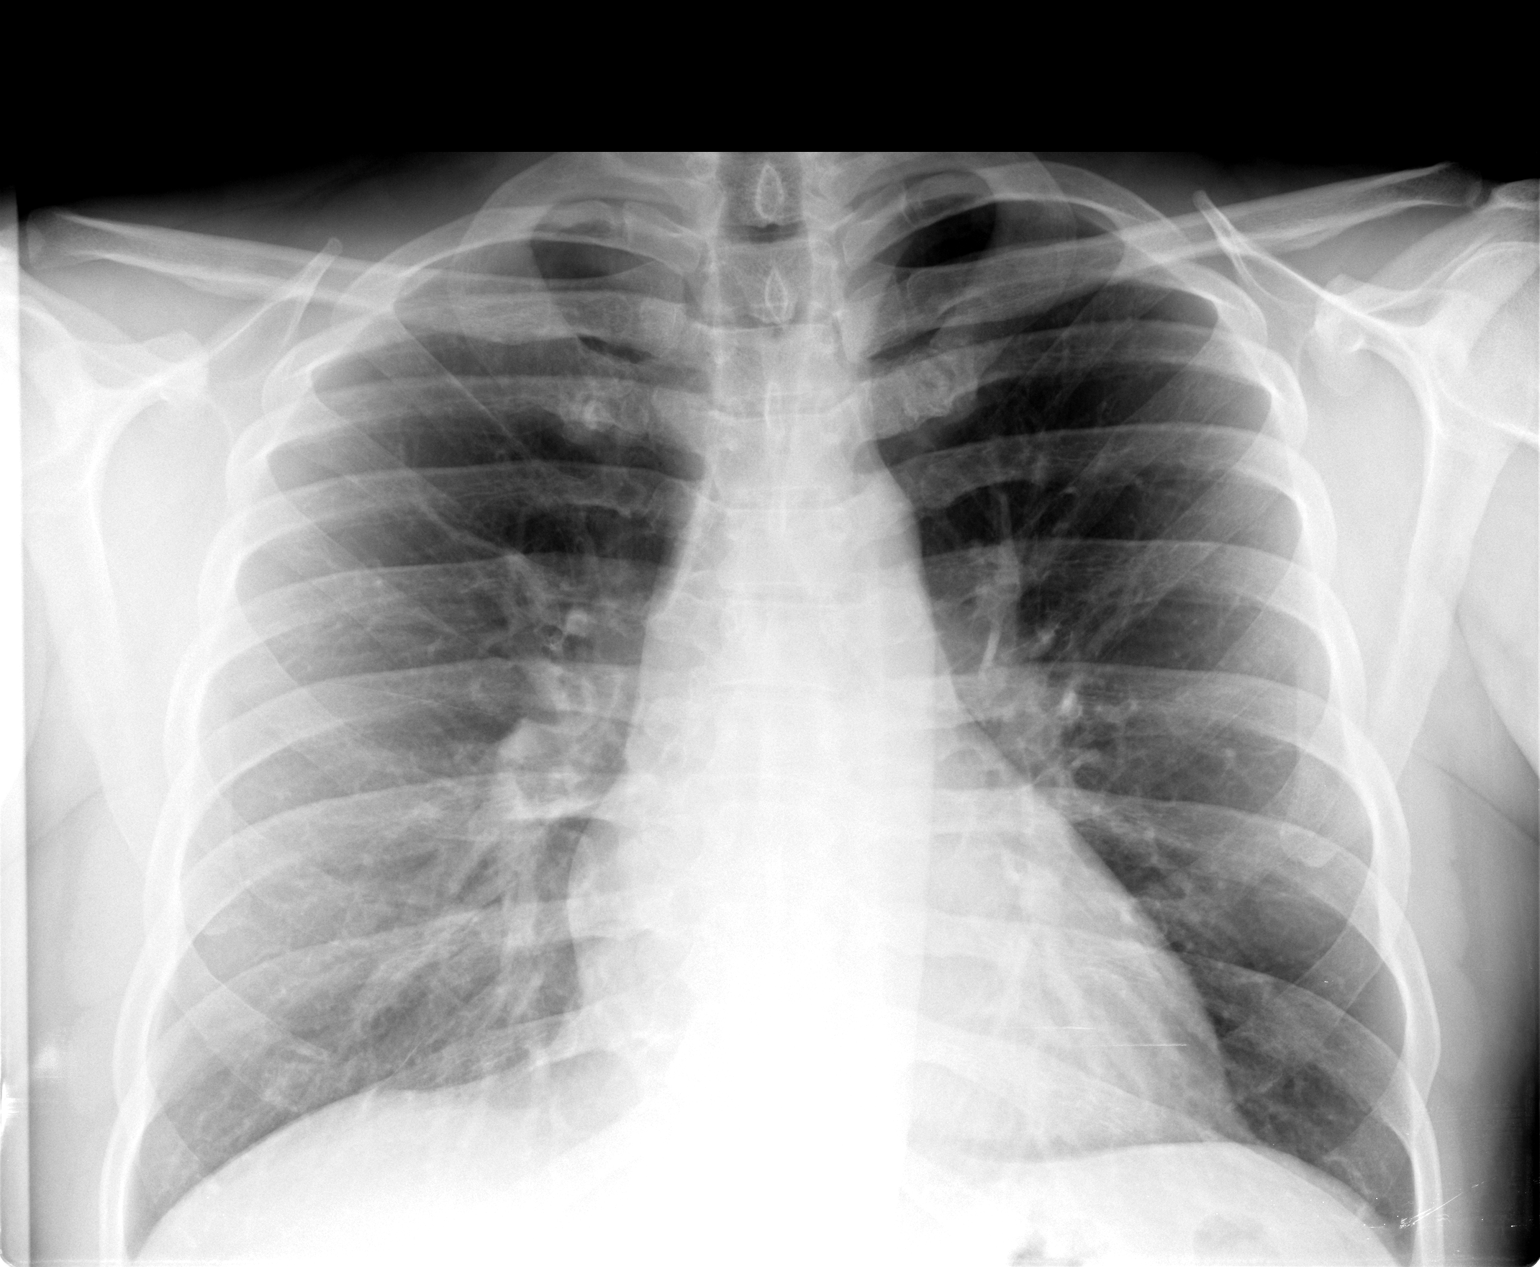

[view not recorded (2 of 2)]
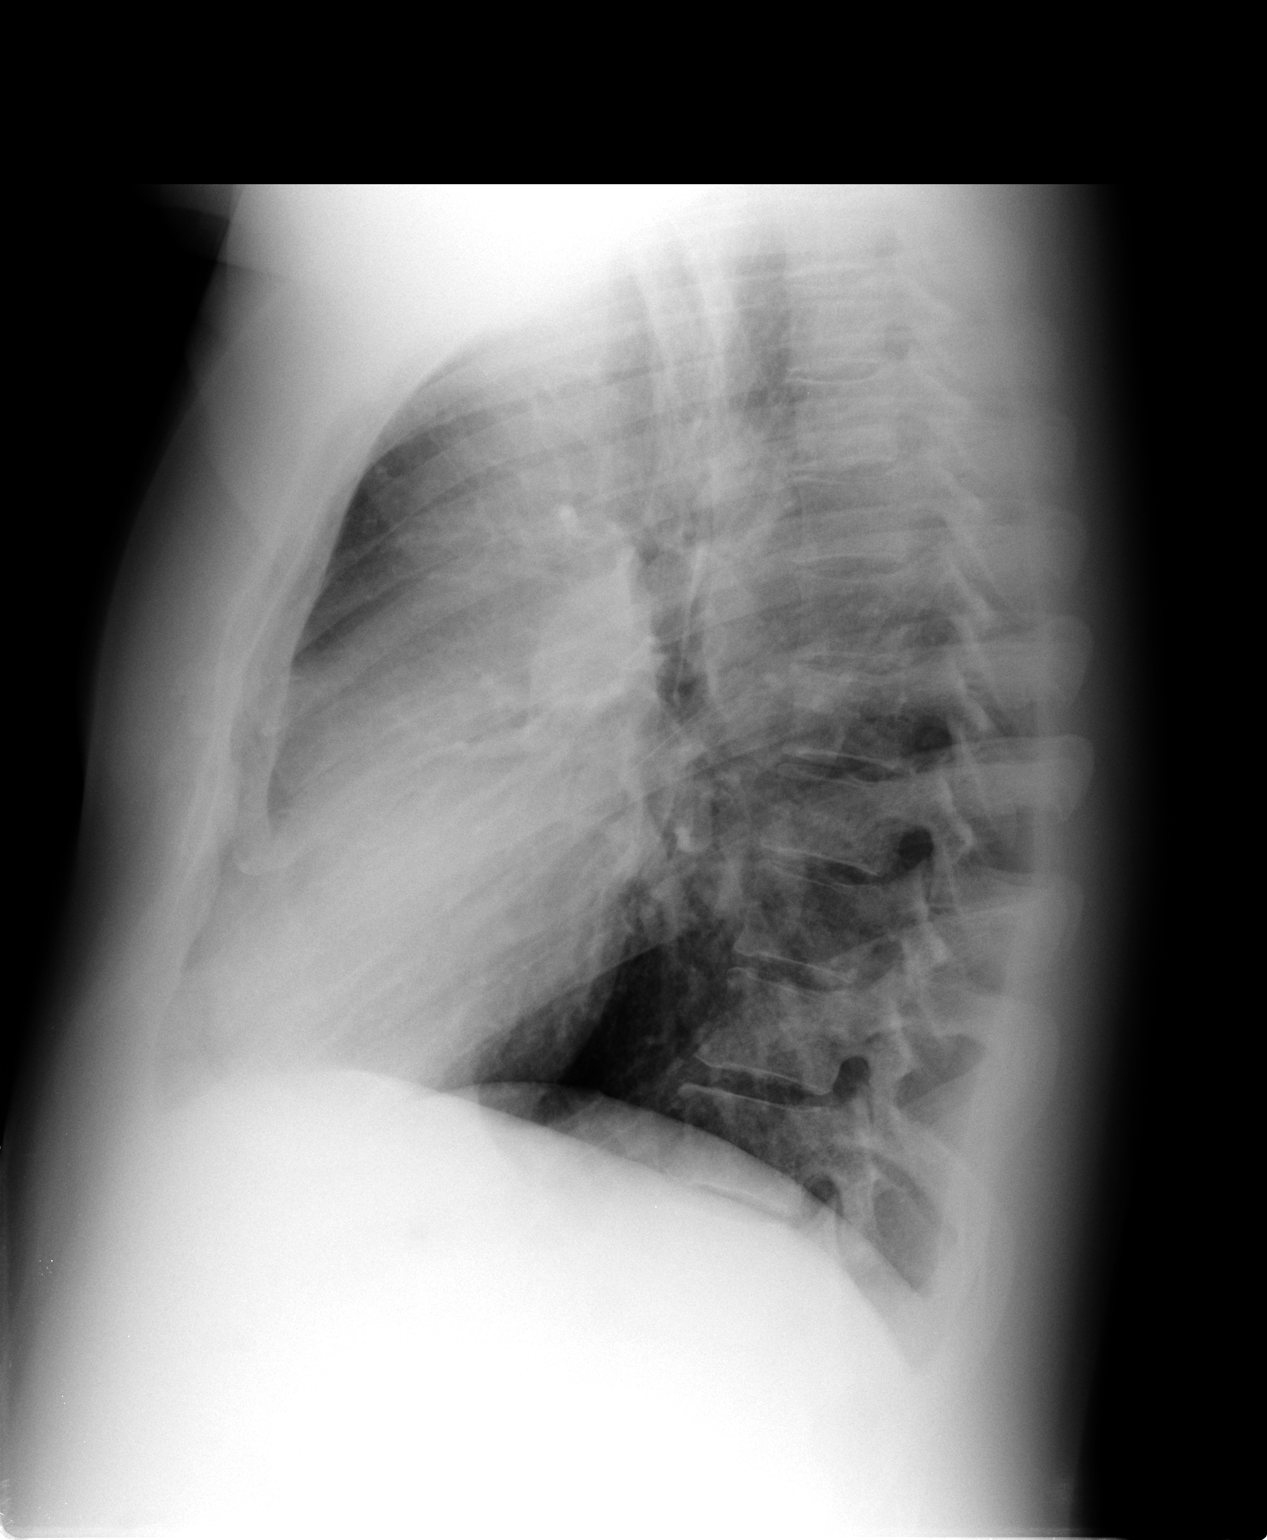

[2 of 2 positions shown; findings below may reference images not displayed]

FINDINGS: The heart size and mediastinal contours are stable.  The
lungs are clear.  There is no pleural effusion or pneumothorax.
IMPRESSION: Stable examination.  No active cardiopulmonary process.

## 2012-05-19 ENCOUNTER — Other Ambulatory Visit: Payer: Self-pay | Admitting: Pulmonary Disease

## 2012-08-25 ENCOUNTER — Other Ambulatory Visit: Payer: Self-pay | Admitting: Pulmonary Disease

## 2012-09-08 ENCOUNTER — Telehealth: Payer: Self-pay | Admitting: Pulmonary Disease

## 2012-09-08 NOTE — Telephone Encounter (Signed)
Spoke with patient Patient requesting refill on acyclovir tablet Per Epic Rx was sent in Feb with 11 refills Called pharmacy to double check Rx still on file-- and this is correct Spoke back with patient and made him aware of this Nothing further needed at this time

## 2012-09-13 ENCOUNTER — Ambulatory Visit: Payer: PRIVATE HEALTH INSURANCE | Admitting: Pulmonary Disease

## 2012-10-05 ENCOUNTER — Other Ambulatory Visit: Payer: Self-pay | Admitting: Pulmonary Disease

## 2012-10-17 ENCOUNTER — Other Ambulatory Visit (INDEPENDENT_AMBULATORY_CARE_PROVIDER_SITE_OTHER): Payer: PRIVATE HEALTH INSURANCE

## 2012-10-17 ENCOUNTER — Encounter: Payer: Self-pay | Admitting: Pulmonary Disease

## 2012-10-17 ENCOUNTER — Ambulatory Visit (INDEPENDENT_AMBULATORY_CARE_PROVIDER_SITE_OTHER): Payer: PRIVATE HEALTH INSURANCE | Admitting: Pulmonary Disease

## 2012-10-17 VITALS — BP 128/82 | HR 52 | Temp 97.8°F | Ht 71.0 in | Wt 217.4 lb

## 2012-10-17 DIAGNOSIS — K219 Gastro-esophageal reflux disease without esophagitis: Secondary | ICD-10-CM

## 2012-10-17 DIAGNOSIS — E7800 Pure hypercholesterolemia, unspecified: Secondary | ICD-10-CM

## 2012-10-17 DIAGNOSIS — Z23 Encounter for immunization: Secondary | ICD-10-CM

## 2012-10-17 DIAGNOSIS — R51 Headache: Secondary | ICD-10-CM

## 2012-10-17 DIAGNOSIS — G2581 Restless legs syndrome: Secondary | ICD-10-CM

## 2012-10-17 DIAGNOSIS — J387 Other diseases of larynx: Secondary | ICD-10-CM

## 2012-10-17 DIAGNOSIS — E663 Overweight: Secondary | ICD-10-CM

## 2012-10-17 DIAGNOSIS — K5900 Constipation, unspecified: Secondary | ICD-10-CM

## 2012-10-17 DIAGNOSIS — F411 Generalized anxiety disorder: Secondary | ICD-10-CM

## 2012-10-17 DIAGNOSIS — E291 Testicular hypofunction: Secondary | ICD-10-CM

## 2012-10-17 DIAGNOSIS — E669 Obesity, unspecified: Secondary | ICD-10-CM | POA: Insufficient documentation

## 2012-10-17 LAB — CBC WITH DIFFERENTIAL/PLATELET
Basophils Absolute: 0 10*3/uL (ref 0.0–0.1)
Eosinophils Absolute: 0.2 10*3/uL (ref 0.0–0.7)
Lymphocytes Relative: 24.4 % (ref 12.0–46.0)
MCHC: 33.4 g/dL (ref 30.0–36.0)
Monocytes Relative: 5.1 % (ref 3.0–12.0)
Neutrophils Relative %: 68.3 % (ref 43.0–77.0)
Platelets: 326 10*3/uL (ref 150.0–400.0)
RDW: 13.9 % (ref 11.5–14.6)

## 2012-10-17 LAB — LIPID PANEL
Cholesterol: 123 mg/dL (ref 0–200)
HDL: 48.3 mg/dL (ref >39.00)
Triglycerides: 26 mg/dL (ref 0.0–149.0)
VLDL: 5.2 mg/dL (ref 0.0–40.0)

## 2012-10-17 LAB — BASIC METABOLIC PANEL
BUN: 12 mg/dL (ref 6–23)
Calcium: 9.5 mg/dL (ref 8.4–10.5)
Creatinine, Ser: 0.8 mg/dL (ref 0.4–1.5)
GFR: 128.53 mL/min (ref >60.00)
Glucose, Bld: 82 mg/dL (ref 70–99)
Sodium: 139 mEq/L (ref 135–145)

## 2012-10-17 MED ORDER — OMEPRAZOLE 40 MG PO CPDR: 40.0000 mg | DELAYED_RELEASE_CAPSULE | Freq: Every day | ORAL | Status: DC

## 2012-10-17 MED ORDER — ACYCLOVIR 200 MG PO CAPS: 200.0000 mg | ORAL_CAPSULE | Freq: Two times a day (BID) | ORAL | Status: DC

## 2012-10-17 MED ORDER — SIMVASTATIN 40 MG PO TABS: ORAL_TABLET | ORAL | Status: DC

## 2012-10-17 MED ORDER — ACYCLOVIR 5 % EX OINT: TOPICAL_OINTMENT | CUTANEOUS | Status: DC

## 2012-10-17 NOTE — Patient Instructions (Addendum)
Today we updated your med list in our EPIC system...    Continue your current medications the same for now...  Today we did your follow up blood work...    We will contact you w/ the results when available...   GREAT JOB w/ DIET & WEIGHT REDUCTION!!!  Call for any questions...  Let's plan a follow up visit in 38yr, sooner if needed for problems.Marland KitchenMarland Kitchen

## 2012-10-17 NOTE — Progress Notes (Signed)
Subjective:     Patient ID: Gregory Santos, male   DOB: [birth year], 47 y.o.   MRN: [id number]  HPI 47 y/o BM here for a f/u visit... he has several med problems as listed below...   ~  March 23, 2011:  Yearly ROV & Tamel continues to feel well, good energy, no new complaints or concerns;  We reviewed his meds, checked FASTING blood work, offered Flu vaccine but he declines "I use soap & water"...    Hx OSA & RLS> still fatigued a lot & falls asleep in church; offered repeat sleep study but he refuses; rec weight loss; he has agreed to trial Requip for the RLS...    Chol> on Simva40; FLP looks good w/ TChol 149, TG 52, HDL 47, LDL 91...    Obesity> needs to do way better on diet + exercise; weight today= 258#, up 16# over the last yr; he'll join a gym he says...    Low-T> known hx atrophic testes likely from mumps orchitis; Testos level = 277 (250-890) & he won't admit to any signif Low-T symptoms stating that drive, desire, performance are all OK; he now agrees to trial of ANDROGEL 1%- 4pumps daily... LABS 2/13 showed:  FLP- ok on Simva40;  Chems- wnl;  CBC- wnl;  TSH=1.16;  Testos=277;  VitD=38  ~  July 06, 2011:  50mo ROV  & Morley has stopped his meds> Simva40 & [name] because his wife did some research & found out that Simvastatin can lower Testosterone levels & she wanted him off it; I explained to pt that his hypogonadism is most likely the result of an old infection like Mumps and it's effect on his testicles which are atrophic and not from any of his meds; since his Chol had been quite elev in the past before medication, he is advised to stay on Statin Rx although I offered to change his med from Simvastatin to another med if he likes (he said his mother wanted him to continue his same meds & this is ultimately what he decided to do)...     We reviewed prob list, meds, xrays and labs> see below>>  ~  March 15, 2012:  66mo ROV & Rigo is c/o a cough- comes & goes, mostly dry cough & he  says that Hydromet called in does not really help, feels like phlegm gets stuck in his throat, notes some SOB w/ the cough he says- we decided to check CXR (clear), PFT (wnl), LABS (all wnl);  Rx w/ Antireflux regimen, Omep40, Tussionex...     Hx OSA & RLS> sleep study 2006 w/ RDI=17 & mod snoring w/ signif leg jerks; never tried CPAP, has Requip 0.5mg  for RLS but only uses it "prn"...    Chol> on Simva40 & tol well; FLP looks good w/ TChol 124, TG 34, HDL 36, LDL 82...    Obesity> needs to do better on diet + exercise; weight today= 252#, no change, he'll join a gym he says...    GI- r/oLPR, polyp, constip> on antireflux regimen, Omep40, Miralax, AnusolHC; we reviewed antireflux regimen, last colon was 2010 w/ hem & 1 sm benign polyp- f/u 63yrs.    Low-T> known hx atrophic testes likely from mumps orchitis; on Fortesta- 4pumps/d & he notes that energy & mojo are OK!    Hx VitD & Iron defic> supposed to take VitD supplement ~2000u daily... We reviewed prob list, meds, xrays and labs> see below for updates >>  CXR 2/14 showed  heart at upper lim of norm, clear lungs, NAD... PFT 2/14 showed FVC=3.59 (81%), FEV1=2.95 (83%), %1sec=82, mid-flows=88% predicted... LABS 2/14:  FLP- at goals on Simva40;  Chems- wnl & BS=106;  CBC- wnl;  TSH=1.18   ~  October 17, 2012:  2mo ROV & Kushal reports breathing well- normal daily activities, on diet & his weight is down 35# to 217# today!!!     Hx OSA & RLS> sleep study 2006 w/ RDI=17 & mod snoring w/ signif leg jerks; never tried CPAP, has Requip 0.5mg  for RLS but only uses it "prn"; states snoring is gone w/ wt reduction & no leg symptoms...    Chol> on Simva40 & tol well; FLP 9/14 looks good w/ TChol 123, TG 26, HDL 48, LDL 70... Ok to decr Simva40- 1/2 tab Qhs.    Obesity> weight today= 217# for a 35# wt loss on diet & exercise program at the gym... Fabulous job & congrats!!!  Keep it up!    GI- r/oLPR, polyp, constip> on antireflux regimen, Omep40, Miralax,  AnusolHC; we reviewed antireflux regimen, last colon was 2010 w/ hem & 1 sm benign polyp- f/u 47yrs.    Low-T> known hx atrophic testes likely from mumps orchitis; on Fortesta- 4pumps/d & he notes that energy, strength, & mojo are OK!    Hx VitD & Hx Iron defic> supposed to take VitD supplement ~2000u daily, & Hg/ Fe has been normal... We reviewed prob list, meds, xrays and labs> see below for updates >>  LABS 9/14:  FLP- at goals on Simva40;  Chems- wnl;  CBC- wnl...           Problem List:    Hx of BRONCHITIS (ICD-490) - no recent symptoms, doing well. ~  CXR 2/12 showed normal heart size, clear lungs, NAD.Marland Kitchen. ~  CXR 2/14 showed heart at upper lim of norm, clear lungs, NAD  OBSTRUCTIVE SLEEP APNEA (ICD-327.23) - sleep study 7/06 w/ RDI 17, desat to 93%, mod snoring, no arrhythmias... he had signif leg jerks, therefore Requip given, but he stopped & denies symptoms... ~  2/13: Offered repeat sleep study to pt but he declines again; agrees to trial Requip for the RLS... ~  2/14: sleep study 2006 w/ RDI=17 & mod snoring w/ signif leg jerks; never tried CPAP, has Requip 0.5mg  for RLS but only uses it "prn"  CHEST WALL PAIN, HX OF (ICD-V15.89)  HYPERCHOLESTEROLEMIA (ICD-272.0) - on SIMVASTATIN 40mg /d... ~  FLP 2/08 showed TChol 201, TG 58, HDL 36, LDL 158... Simvastatin 40mg /d started then. ~  FLP 4/09 showed TChol 141, TG 34, HDL 33, LDL 101... rec- same med, better diet. ~  FLP 1/10 showed TChol 115, TG 29, HDL 40, LDL 69 ~  FLP 1/11 showed TChol 122, TG 32, HDL 47, LDL 68 ~  FLP 2/12 on Simva40 showed TChol 131, TG 32, HDL 44, LDL 81 ~  FLP 2/13 on Simva40 showed TChol 149, TG 52, HDL 47, LDL 91 ~  6/13:  Pt had stopped Simva40 transiently as his wife found research indicating that the Simvastatin caused Low-T; we discussed his situation & he decided to restart his statin rx... ~  FLP 2/14 on Simva40 showed TChol 124, TG 34, HDL 36, LDL 82  OBESITY (ICD-278.00) -  ~  weight 1/10 = 249#,  up 9# since last OV.Marland Kitchen. ~  weight 1/11 = 246# ~  weight 2/12 = 242# ~  Weight 2/13 = 258#... We reviewed diet, exercise, wt reduction strategies... ~  Weight 6/13 = 252# ~  Weight 2/14 = 252#  CONSTIPATION (ICD-564.00) - on Miralax + Anusol HC Prn... Hx blood in stool & referred to GI w/ eval DrJacobs 3/10- colonoscopy showed hemorroid + 1 sm polyp= lymphoid aggregate, f/u 60yrs.  TESTOSTERONE DEFICIENCY (ICD-257.2) - he has testic atrophy on exam, but remains asymptomatic w/ good energy, drive, performance, etc & he declines replacement Rx or Urology consultation... ~  labs 3/09 showed testos level = 309 (350-890) ~  labs 1/11 showed testos level = 299 ~  labs 2/12 showed testos level = 230... he still declines replacement Rx. ~  Labs 2/13 showed Testos level = 277... He agrees to trial ANDROGEL 1%- 4pumps daily (Insurance changed to [place]). ~  6/13: He is rec to continue the [name] & have Testos level rechecked, if not up to normal range he will need referral to Urology... ~  2/14: known hx atrophic testes likely from mumps orchitis; on Fortesta- 4pumps/d & he notes that energy & mojo are OK! He has not had f/u testos levels on Rx...  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  HEADACHE (ICD-784.0) - prev Rx w/ Topomax 100mg /d and Flexeril 10mg Prn per DrFreeman  Hx of BELLS PALSY (ICD-351.0)  RESTLESS LEG SYNDROME (ICD-333.94) - not currently on Rx- prev Requip but denied symptoms and stopped it on his own... ~  2/13: notes that leg movements annoy wife & wake him up at night; agrees to trial REQUIP 0.5mg - 1Qhs ==> incr to 2Qhs...  ANXIETY (ICD-300.00)  VITAMIN D DEFICIENCY (ICD-268.9) - on Vit D OTC 1000 u daily... ~  labs 1/10 showed Vit D level = 16... rec> OTC Vit D 01-1998 daily. ~  labs 2/12 showed Vit D level = 30... rec to continue 01-1998 u daily. ~  Labs 2/13 showed Vit D level = 38... rec to take 2000u Vit D daily  supplement... ~  2/14: he is reminded to stay on the Men's formula MVI & VitD supplement daily...  Hx of IRON DEFICIENCY (ICD-280.9) - on OTC Fe supplement daily... ~  labs 1/10 showed Hg= 14.6, MCV= 91, Fe= 37 (sat=10%) ~  labs 1/11 showed Hg= 13.5, MCV= 93, Fe= 85 (sat=26%) ~  labs 2/12 showed Hg= 14.0, MCV= 90 ~  Labs 2/13 showed Hg= 13.9, MCV= 89 ~  Labs 2/14 showed Hg= 14.4   Past Surgical History  Procedure Laterality Date  . Right inguinal hernia repair as a child      Outpatient Encounter Prescriptions as of 10/17/2012  Medication Sig Dispense Refill  . acyclovir (ZOVIRAX) 200 MG capsule Take 1 capsule (200 mg total) by mouth 2 (two) times daily.  60 capsule  11  . acyclovir ointment (ZOVIRAX) 5 % 1 application every 3 hrs as needed  15 g  11  . chlorpheniramine-HYDROcodone (TUSSIONEX PENNKINETIC ER) 10-8 MG/5ML LQCR Take 5 mLs by mouth every 12 (twelve) hours.  140 mL  3  . clotrimazole-betamethasone (LOTRISONE) cream APPLY TO RASH AS NEEDED  45 g  11  . Diphenhyd-Hydrocort-Nystatin (FIRST-DUKES MOUTHWASH) SUSP 1 tsp gargle and swallow four times daily as needed  120 mL  1  . FORTESTA 10 MG/ACT (2%) GEL APPLY 4 APPLICATIONS ONTO THE SKIN DAILY AS DIRECTED  60 g  5  . omeprazole (PRILOSEC) 40 MG capsule Take 1 capsule (40 mg total) by mouth daily. Take 30 minutes before dinner  30 capsule  11  . polyethylene glycol (MIRALAX /  GLYCOLAX) packet Take 17 g by mouth daily as needed.       Marland Kitchen PROCTOSOL HC 2.5 % rectal cream APPLY RECTALLY 2 (TWO) TIMES DAILY.  28.35 g  11  . simvastatin (ZOCOR) 40 MG tablet TAKE 1 TABLET BY MOUTH AT BEDTIME  90 tablet  1  . [DISCONTINUED] acyclovir (ZOVIRAX) 200 MG capsule TAKE ONE CAPSULE BY MOUTH TWICE DAILY  60 capsule  0  . [DISCONTINUED] clotrimazole-betamethasone (LOTRISONE) cream APPLY TO AFFECTED AREA AS NEEDED FOR RASH AS DIRECTED  30 g  0  . [DISCONTINUED] HYDROcodone-homatropine (HYDROMET) 5-1.5 MG/5ML syrup 1-2 tsp every 6 hours as needed.  May cause sedation  140 mL  0  . [DISCONTINUED] rOPINIRole (REQUIP) 0.5 MG tablet Take as directed  180 tablet  3   No facility-administered encounter medications on file as of 10/17/2012.    Allergies  Allergen Reactions  . Codeine Phosphate     REACTION: nausea and vomiting    Current Medications, Allergies, Past Medical History, Past Surgical History, Family History, and Social History were reviewed in [employer] record.   Review of Systems    The patient denies fever, chills, sweats, anorexia, fatigue, weakness, malaise, weight loss, sleep disorder, blurring, diplopia, eye irritation, eye discharge, vision loss, eye pain, photophobia, earache, ear discharge, tinnitus, decreased hearing, nasal congestion, nosebleeds, sore throat, hoarseness, chest pain, palpitations, syncope, dyspnea on exertion, orthopnea, PND, peripheral edema, cough, dyspnea at rest, excessive sputum, hemoptysis, wheezing, pleurisy, nausea, vomiting, diarrhea, constipation, change in bowel habits, abdominal pain, melena, hematochezia, jaundice, gas/bloating, indigestion/heartburn, dysphagia, odynophagia, dysuria, hematuria, urinary frequency, urinary hesitancy, nocturia, incontinence, back pain, joint pain, joint swelling, muscle cramps, muscle weakness, stiffness, arthritis, sciatica, restless legs, leg pain at night, leg pain with exertion, rash, itching, dryness, suspicious lesions, paralysis, paresthesias, seizures, tremors, vertigo, transient blindness, frequent falls, frequent headaches, difficulty walking, depression, anxiety, memory loss, confusion, cold intolerance, heat intolerance, polydipsia, polyphagia, polyuria, unusual weight change, abnormal bruising, bleeding, enlarged lymph nodes, urticaria, allergic rash, hay fever, and recurrent infections.     Objective:   Physical Exam    WD, Overweight, 47 y/o BM in NAD... GENERAL:  Alert & oriented; pleasant & cooperative... HEENT:  [place]/AT,  EOM-wnl, PERRLA, EACs-clear, TMs-wnl, NOSE-clear, THROAT-clear & wnl. NECK:  Supple w/ full ROM; no JVD; normal carotid impulses w/o bruits; no thyromegaly or nodules palpated; no lymphadenopathy. CHEST:  Clear to P & A; without wheezes/ rales/ or rhonchi. HEART:  Regular Rhythm; without murmurs/ rubs/ or gallops. ABDOMEN:  Soft & nontender; normal bowel sounds; no organomegaly or masses detected. RECTAL:  Neg - prostate 2+ & min tender w/o nodules; testicles are atrophic; stool hematest neg. EXT: without deformities or arthritic changes; no varicose veins/ venous insuffic/ or edema. NEURO:  CN's intact; motor testing normal; sensory testing normal; gait normal & balance OK. DERM:  No lesions noted; no rash etc...  RADIOLOGY DATA:  Reviewed in the EPIC EMR & discussed w/ the patient...  LABORATORY DATA:  Reviewed in the EPIC EMR & discussed w/ the patient...   Assessment:      OSA/ RLS>  He prev refused repeat sleep study;  Now symptoms much better/ resolved w/ substantial wt reduction...  CHOL>  On Simva40 & FLP looks good; continue diet/ exercise/ get wt down/ continue Simva40 but ok to cut to 1/2 tab Qhs......  Obesity>  Weight reduction is the key to several of his problems=> nice job w/ diet & exercise...  Constip/ mild rectal stricture>  Discussed Miralax vs Senokot-S regularly...  Testos defic>  He denies symptoms & has remained on the [place]- 4 pumps daily...  Hx HSV2>  On Acyclovir suppression & uses zovirax ointment as needed...  Vit D Defic>  rec to restart his Vit D OTC supplement to 2000u daily...     Plan:     Patient's Medications  New Prescriptions   AMOXICILLIN-CLAVULANATE (AUGMENTIN) 875-125 MG PER TABLET    Take 1 tablet by mouth 2 (two) times daily.   NEOMYCIN-POLYMYXIN-HYDROCORTISONE (CORTISPORIN) 1 % SOLN OTIC SOLUTION    Place 2 drops into both ears 3 (three) times daily.  Previous Medications   CHLORPHENIRAMINE-HYDROCODONE (TUSSIONEX PENNKINETIC ER)  10-8 MG/5ML LQCR    Take 5 mLs by mouth every 12 (twelve) hours.   DIPHENHYD-HYDROCORT-NYSTATIN (FIRST-DUKES MOUTHWASH) SUSP    1 tsp gargle and swallow four times daily as needed   POLYETHYLENE GLYCOL (MIRALAX / GLYCOLAX) PACKET    Take 17 g by mouth daily as needed.    PROCTOSOL HC 2.5 % RECTAL CREAM    APPLY RECTALLY 2 (TWO) TIMES DAILY.  Modified Medications   Modified Medication Previous Medication   ACYCLOVIR (ZOVIRAX) 200 MG CAPSULE acyclovir (ZOVIRAX) 200 MG capsule      Take 1 capsule (200 mg total) by mouth 2 (two) times daily.    Take 1 capsule (200 mg total) by mouth 2 (two) times daily.   ACYCLOVIR OINTMENT (ZOVIRAX) 5 % acyclovir ointment (ZOVIRAX) 5 %      1 application every 3 hrs as needed    1 application every 3 hrs as needed   CLOTRIMAZOLE-BETAMETHASONE (LOTRISONE) CREAM clotrimazole-betamethasone (LOTRISONE) cream      APPLY TO RASH AS NEEDED    APPLY TO RASH AS NEEDED   OMEPRAZOLE (PRILOSEC) 40 MG CAPSULE omeprazole (PRILOSEC) 40 MG capsule      Take 1 capsule (40 mg total) by mouth daily. Take 30 minutes before dinner    Take 1 capsule (40 mg total) by mouth daily. Take 30 minutes before dinner   SIMVASTATIN (ZOCOR) 40 MG TABLET simvastatin (ZOCOR) 40 MG tablet      TAKE 1/2  TABLET BY MOUTH AT BEDTIME    TAKE 1 TABLET BY MOUTH AT BEDTIME   TESTOSTERONE (FORTESTA) 10 MG/ACT (2%) GEL FORTESTA 10 MG/ACT (2%) GEL      Apply 4 application topically daily. Apply onto skin daily as directed    APPLY 4 APPLICATIONS ONTO THE SKIN DAILY AS DIRECTED  Discontinued Medications   ACYCLOVIR (ZOVIRAX) 200 MG CAPSULE    TAKE ONE CAPSULE BY MOUTH TWICE DAILY   CLOTRIMAZOLE-BETAMETHASONE (LOTRISONE) CREAM    APPLY TO AFFECTED AREA AS NEEDED FOR RASH AS DIRECTED   HYDROCODONE-HOMATROPINE (HYDROMET) 5-1.5 MG/5ML SYRUP    1-2 tsp every 6 hours as needed. May cause sedation   ROPINIROLE (REQUIP) 0.5 MG TABLET    Take as directed   SIMVASTATIN (ZOCOR) 40 MG TABLET    TAKE 1 TABLET BY MOUTH AT  BEDTIME

## 2012-10-20 ENCOUNTER — Telehealth: Payer: Self-pay | Admitting: Pulmonary Disease

## 2012-10-20 MED ORDER — CLOTRIMAZOLE-BETAMETHASONE 1-0.05 % EX CREA: TOPICAL_CREAM | CUTANEOUS | Status: DC

## 2012-10-20 MED ORDER — TESTOSTERONE 10 MG/ACT (2%) TD GEL: 4.0000 "application " | Freq: Every day | TRANSDERMAL | Status: DC

## 2012-10-20 NOTE — Telephone Encounter (Signed)
Called and spoke with pt and he is aware of lab results per SN.  He is aware to cut the simvastatin 40 mg  In half.  The refill of the simvastatin and the omeprazole have been cancelled and the pt will call when he needs a refill of these meds that will [facility] [address]. Pt is aware of new directions on the simvastatin.  Nothing further is needed.

## 2012-10-20 NOTE — Telephone Encounter (Signed)
Spoke with [facility]-- Medications sent to [facility] cannot be cancelled (Simvastatin and Omep) . They have already been processed and shipped and pt should be receiving in the next few days.  Spoke with SN nurse Leigh in regards to this matter and Marliss Czar states that she is going to contact patient.   I have however sent Fortesta and Lotrisone to [facility] [address]

## 2012-10-20 NOTE — Telephone Encounter (Addendum)
Pt medications sent to wrong pharmacy at last OV.  Pt states that he does not use [facility]. Requests that the Simvastatin, Lotrisone, Omep and Testosterone [name] be sent to [facility] instead.  Medications have been sent and [facility] to be cancelled.

## 2012-10-20 NOTE — Telephone Encounter (Signed)
Called and spoke with pt and he is aware of meds that were sent to [facility].   He stated that he will take the ones being delivered but he does not want to keep getting these meds from the local pharmacy.   Pt is aware that other [facility] [address].  Pt is aware.

## 2012-11-20 ENCOUNTER — Telehealth: Payer: Self-pay | Admitting: Pulmonary Disease

## 2012-11-20 MED ORDER — NEOMYCIN-POLYMYXIN-HC 1 % OT SOLN: 2.0000 [drp] | Freq: Three times a day (TID) | OTIC | Status: DC

## 2012-11-20 MED ORDER — AMOXICILLIN-POT CLAVULANATE 875-125 MG PO TABS: 1.0000 | ORAL_TABLET | Freq: Two times a day (BID) | ORAL | Status: DC

## 2012-11-20 NOTE — Telephone Encounter (Signed)
Last OV 10-17-12. Pt is c/o ear pain that is worse in his left ear, but both ears hurt. He denies any chest congestion, cough, sinus congestion, fever. He states he gets an ear infection when the weather changes. Pt is requesting an antibiotic and ear drops. Please advise. Carron Curie, CMA Allergies  Allergen Reactions  . Codeine Phosphate     REACTION: nausea and vomiting

## 2012-11-20 NOTE — Telephone Encounter (Signed)
Pt is aware of SN recs. States that he will call us back if things do not improve. Rx have been sent in.

## 2012-11-20 NOTE — Telephone Encounter (Signed)
Per SN---  augmentin 875 mg  #14  1 po bid Cortisporin otic suspension  2 drops in affected ear tid Pt will need to see ENT if not better.

## 2012-12-25 ENCOUNTER — Telehealth: Payer: Self-pay | Admitting: Pulmonary Disease

## 2012-12-25 NOTE — Telephone Encounter (Signed)
Per SN---  BP may have dropped when standing Change positions slowly [facility] recheck testosterone level if he wants this checked.

## 2012-12-25 NOTE — Telephone Encounter (Signed)
I called and spoke with pt. He reports yesterday he had an episode were he felt dizzy and when he stood up he felt like he was going to pass out. He reports he takes medication for low testosterone but not sure if his Testerone level is low. Pt last had this checked in 02/2011. Please advise Dr. Kriste Basque thanks  Allergies  Allergen Reactions  . Codeine Phosphate     REACTION: nausea and vomiting

## 2012-12-25 NOTE — Telephone Encounter (Signed)
I called and made pt aware. He reports he will keep an eye on things first. Nothing further needed

## 2013-02-01 ENCOUNTER — Telehealth: Payer: Self-pay | Admitting: Pulmonary Disease

## 2013-02-01 ENCOUNTER — Other Ambulatory Visit: Payer: Self-pay | Admitting: Pulmonary Disease

## 2013-02-01 MED ORDER — CLOTRIMAZOLE-BETAMETHASONE 1-0.05 % EX CREA: TOPICAL_CREAM | CUTANEOUS | Status: DC

## 2013-02-01 MED ORDER — HYDROCORTISONE 2.5 % RE CREA: TOPICAL_CREAM | RECTAL | Status: DC

## 2013-02-01 MED ORDER — SIMVASTATIN 40 MG PO TABS: ORAL_TABLET | ORAL | Status: DC

## 2013-02-01 NOTE — Telephone Encounter (Signed)
Called and spoke with pt and he stated that [facility] no refills on his medications from September.   These have been re-sent to the pharmacy and the pt is aware.

## 2013-03-21 ENCOUNTER — Telehealth: Payer: Self-pay | Admitting: Pulmonary Disease

## 2013-03-21 NOTE — Telephone Encounter (Signed)
Called and spoke with pt and he is aware of appt scheduled with SN on 3/30 at 2:30

## 2013-03-21 NOTE — Telephone Encounter (Signed)
Pt wanted to know when he last had colonoscopy. I advised him according to our records it was 03/2008. He is requesting to see SN one last time before he retires. Please advise SN thanks

## 2013-04-23 ENCOUNTER — Encounter: Payer: Self-pay | Admitting: Pulmonary Disease

## 2013-04-23 ENCOUNTER — Ambulatory Visit (INDEPENDENT_AMBULATORY_CARE_PROVIDER_SITE_OTHER): Payer: PRIVATE HEALTH INSURANCE | Admitting: Pulmonary Disease

## 2013-04-23 VITALS — BP 136/86 | HR 58 | Temp 97.8°F | Ht 71.0 in | Wt 222.6 lb

## 2013-04-23 DIAGNOSIS — R51 Headache: Secondary | ICD-10-CM

## 2013-04-23 DIAGNOSIS — G2581 Restless legs syndrome: Secondary | ICD-10-CM

## 2013-04-23 DIAGNOSIS — K219 Gastro-esophageal reflux disease without esophagitis: Secondary | ICD-10-CM

## 2013-04-23 DIAGNOSIS — K5900 Constipation, unspecified: Secondary | ICD-10-CM

## 2013-04-23 DIAGNOSIS — E7800 Pure hypercholesterolemia, unspecified: Secondary | ICD-10-CM

## 2013-04-23 DIAGNOSIS — J387 Other diseases of larynx: Secondary | ICD-10-CM

## 2013-04-23 DIAGNOSIS — K649 Unspecified hemorrhoids: Secondary | ICD-10-CM

## 2013-04-23 DIAGNOSIS — F411 Generalized anxiety disorder: Secondary | ICD-10-CM

## 2013-04-23 DIAGNOSIS — E663 Overweight: Secondary | ICD-10-CM

## 2013-04-23 DIAGNOSIS — E291 Testicular hypofunction: Secondary | ICD-10-CM

## 2013-04-23 MED ORDER — CLOTRIMAZOLE-BETAMETHASONE 1-0.05 % EX CREA: TOPICAL_CREAM | CUTANEOUS | Status: DC

## 2013-04-23 MED ORDER — OMEPRAZOLE 40 MG PO CPDR: 40.0000 mg | DELAYED_RELEASE_CAPSULE | Freq: Every day | ORAL | Status: DC

## 2013-04-23 MED ORDER — NEOMYCIN-POLYMYXIN-HC 1 % OT SOLN: 2.0000 [drp] | Freq: Three times a day (TID) | OTIC | Status: DC

## 2013-04-23 MED ORDER — SIMVASTATIN 40 MG PO TABS: ORAL_TABLET | ORAL | Status: DC

## 2013-04-23 MED ORDER — HYDROCORTISONE 2.5 % RE CREA: TOPICAL_CREAM | RECTAL | Status: DC

## 2013-04-23 MED ORDER — ACYCLOVIR 200 MG PO CAPS: 200.0000 mg | ORAL_CAPSULE | Freq: Two times a day (BID) | ORAL | Status: DC

## 2013-04-23 MED ORDER — TESTOSTERONE 10 MG/ACT (2%) TD GEL: 4.0000 "application " | Freq: Every day | TRANSDERMAL | Status: DC

## 2013-04-23 MED ORDER — FIRST-DUKES MOUTHWASH MT SUSP: OROMUCOSAL | Status: DC

## 2013-04-23 MED ORDER — ACYCLOVIR 5 % EX OINT: TOPICAL_OINTMENT | CUTANEOUS | Status: DC

## 2013-04-23 NOTE — Patient Instructions (Signed)
Today we updated your med list in our EPIC system...    Continue your current medications the same...    We refilled your meds per request...  Please return to our lab one morning soon for your FASTING lipid panel...    We will contact you w/ the results when available...   Call for any questions.Marland Kitchen..Marland Kitchen

## 2013-04-23 NOTE — Progress Notes (Signed)
Subjective:     Patient ID: Gregory Santos, male   DOB: 1965/03/06, 48 y.o.   MRN: [id number]  HPI 48 y/o BM here for a f/u visit... he has several med problems as listed below...   ~  July 06, 2011:  41mo ROV  & Gregory Santos has stopped his meds> Simva40 & [name] because his wife did some research & found out that Simvastatin can lower Testosterone levels & she wanted him off it; I explained to pt that his hypogonadism is most likely the result of an old infection like Mumps and it's effect on his testicles which are atrophic and not from any of his meds; since his Chol had been quite elev in the past before medication, he is advised to stay on Statin Rx although I offered to change his med from Simvastatin to another med if he likes (he said his mother wanted him to continue his same meds & this is ultimately what he decided to do)...     We reviewed prob list, meds, xrays and labs> see below>>  ~  March 15, 2012:  34mo ROV & Gregory Santos is c/o a cough- comes & goes, mostly dry cough & he says that Hydromet called in does not really help, feels like phlegm gets stuck in his throat, notes some SOB w/ the cough he says- we decided to check CXR (clear), PFT (wnl), LABS (all wnl);  Rx w/ Antireflux regimen, Omep40, Tussionex...     Hx OSA & RLS> sleep study 2006 w/ RDI=17 & mod snoring w/ signif leg jerks; never tried CPAP, has Requip 0.5mg  for RLS but only uses it "prn"...    Chol> on Simva40 & tol well; FLP looks good w/ TChol 124, TG 34, HDL 36, LDL 82...    Obesity> needs to do better on diet + exercise; weight today= 252#, no change, he'll join a gym he says...    GI- r/oLPR, polyp, constip> on antireflux regimen, Omep40, Miralax, AnusolHC; we reviewed antireflux regimen, last colon was 2010 w/ hem & 1 sm benign polyp- f/u 78yrs.    Low-T> known hx atrophic testes likely from mumps orchitis; on Fortesta- 4pumps/d & he notes that energy & mojo are OK!    Hx VitD & Iron defic> supposed to take VitD supplement  ~2000u daily... We reviewed prob list, meds, xrays and labs> see below for updates >>   CXR 2/14 showed heart at upper lim of norm, clear lungs, NAD...  PFT 2/14 showed FVC=3.59 (81%), FEV1=2.95 (83%), %1sec=82, mid-flows=88% predicted...  LABS 2/14:  FLP- at goals on Simva40;  Chems- wnl & BS=106;  CBC- wnl;  TSH=1.18   ~  October 17, 2012:  45mo ROV & Gregory Santos reports breathing well- normal daily activities, on diet & his weight is down 35# to 217# today!!!     Hx OSA & RLS> sleep study 2006 w/ RDI=17 & mod snoring w/ signif leg jerks; never tried CPAP, has Requip 0.5mg  for RLS but only uses it "prn"; states snoring is gone w/ wt reduction & no leg symptoms...    Chol> on Simva40 & tol well; FLP 9/14 looks good w/ TChol 123, TG 26, HDL 48, LDL 70... Ok to decr Simva40- 1/2 tab Qhs.    Obesity> weight today= 217# for a 35# wt loss on diet & exercise program at the gym... Fabulous job & congrats!!!  Keep it up!    GI- r/oLPR, polyp, constip> on antireflux regimen, Omep40, Miralax, AnusolHC; we reviewed antireflux regimen,  last colon was 2010 w/ hem & 1 sm benign polyp- f/u 5778yrs.    Low-T> known hx atrophic testes likely from mumps orchitis; on Fortesta- 4pumps/d & he notes that energy, strength, & mojo are OK!    Hx VitD & Hx Iron defic> supposed to take VitD supplement ~2000u daily, & Hg/ Fe has been normal... We reviewed prob list, meds, xrays and labs> see below for updates >> Given 2014 flu vaccine...   LABS 9/14:  FLP- at goals on Simva40;  Chems- wnl;  CBC- wnl...   ~  April 23, 2013:  6mo ROV & Gregory Santos has regained a few lbs to 223#- encouraged diet, exercise, continued wt reduction... We reviewed the following medical problems during today's office visit >>     Hx OSA & RLS> sleep study 2006 w/ RDI=17 & mod snoring w/ signif leg jerks; never tried CPAP, has Requip 0.5mg  for RLS but only uses it "prn"; states snoring is gone w/ wt reduction & no leg symptoms...    Chol> on Simva40-1/2 daily  & tol well; FLP 3/15 looks good w/ TChol 132, TG 19, HDL 49, LDL 79... Continue same.    Obesity> weight today= 223# which is up 6# and we reviewed diet, exercise & wt reduction strategies...    GI- r/oLPR, polyp, constip> on antireflux regimen, Omep40, Miralax, AnusolHC; we reviewed antireflux regimen, last colon was 2010 w/ hem & 1 sm benign polyp- f/u 2978yrs.    Low-T> known hx atrophic testes likely from mumps orchitis; on Fortesta- 4pumps/d & he notes that energy, strength, & mojo are OK!    Hx VitD & Hx Iron defic> supposed to take VitD supplement ~2000u daily, & Hg/ Fe has been normal... We reviewed prob list, meds, xrays and labs> see below for updates >> requests refills of all meds for 30d supplies...  LABS 3/15:  FLP- at goals on simva40-1/2 daily;  TSH= 1/19...          Problem List:    Hx of BRONCHITIS (ICD-490) - no recent symptoms, doing well. ~  CXR 2/12 showed normal heart size, clear lungs, NAD.Marland Kitchen.. ~  CXR 2/14 showed heart at upper lim of norm, clear lungs, NAD  OBSTRUCTIVE SLEEP APNEA (ICD-327.23) - sleep study 7/06 w/ RDI 17, desat to 93%, mod snoring, no arrhythmias... he had signif leg jerks, therefore Requip given, but he stopped & denies symptoms... ~  2/13: Offered repeat sleep study to pt but he declines again; agrees to trial Requip for the RLS... ~  2/14: sleep study 2006 w/ RDI=17 & mod snoring w/ signif leg jerks; never tried CPAP, has Requip 0.5mg  for RLS but only uses it "prn"  CHEST WALL PAIN, HX OF (ICD-V15.89)  HYPERCHOLESTEROLEMIA (ICD-272.0) - on SIMVASTATIN 40mg /d... ~  FLP 2/08 showed TChol 201, TG 58, HDL 36, LDL 158... Simvastatin 40mg /d started then. ~  FLP 4/09 showed TChol 141, TG 34, HDL 33, LDL 101... rec- same med, better diet. ~  FLP 1/10 showed TChol 115, TG 29, HDL 40, LDL 69 ~  FLP 1/11 showed TChol 122, TG 32, HDL 47, LDL 68 ~  FLP 2/12 on Simva40 showed TChol 131, TG 32, HDL 44, LDL 81 ~  FLP 2/13 on Simva40 showed TChol 149, TG 52, HDL  47, LDL 91 ~  6/13:  Pt had stopped Simva40 transiently as his wife found research indicating that the Simvastatin caused Low-T; we discussed his situation & he decided to restart his statin rx... ~  FLP 2/14 on Simva40 showed TChol 124, TG 34, HDL 36, LDL 82 ~  FLP 9/14 on Simva40 showed TChol 123, TG 26, HDL 48, LDL 70 ~  FLP 3/15 on Simva40-1/2 showed TChol 132, TG 19, HDL 49, LDL 79  OBESITY (ICD-278.00) -  ~  weight 1/10 = 249#, up 9# since last OV.Marland Kitchen. ~  weight 1/11 = 246# ~  weight 2/12 = 242# ~  Weight 2/13 = 258#... We reviewed diet, exercise, wt reduction strategies... ~  Weight 6/13 = 252# ~  Weight 2/14 = 252# ~  Weight 9/14 = 217# for a 35# wt loss on diet, exercise! ~  Weight 3/15 = 223#  CONSTIPATION (ICD-564.00) - on Miralax + Anusol HC Prn... Hx blood in stool & referred to GI w/ eval DrJacobs 3/10- colonoscopy showed hemorroid + 1 sm polyp= lymphoid aggregate, f/u 1yrs.  TESTOSTERONE DEFICIENCY (ICD-257.2) - he has testic atrophy on exam, but remains asymptomatic w/ good energy, drive, performance, etc & he declines replacement Rx or Urology consultation... ~  labs 3/09 showed testos level = 309 ([phone]) ~  labs 1/11 showed testos level = 299 ~  labs 2/12 showed testos level = 230... he still declines replacement Rx. ~  Labs 2/13 showed Testos level = 277... He agrees to trial ANDROGEL 1%- 4pumps daily (Insurance changed to [place]). ~  6/13: He is rec to continue the [name] & have Testos level rechecked, if not up to normal range he will need referral to Urology... ~  2/14: known hx atrophic testes likely from mumps orchitis; on Fortesta- 4pumps/d & he notes that energy & mojo are OK! He has not had f/u testos levels on Rx...  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  HEADACHE (ICD-784.0) - prev Rx w/ Topomax 100mg /d and Flexeril 10mg Prn per DrFreeman  Hx of BELLS PALSY (ICD-351.0)  RESTLESS LEG SYNDROME  (ICD-333.94) - not currently on Rx- prev Requip but denied symptoms and stopped it on his own... ~  2/13: notes that leg movements annoy wife & wake him up at night; agrees to trial REQUIP 0.5mg - 1Qhs ==> incr to 2Qhs...  ANXIETY (ICD-300.00)  VITAMIN D DEFICIENCY (ICD-268.9) - on Vit D OTC 1000 u daily... ~  labs 1/10 showed Vit D level = 16... rec> OTC Vit D 01-1998 daily. ~  labs 2/12 showed Vit D level = 30... rec to continue 01-1998 u daily. ~  Labs 2/13 showed Vit D level = 38... rec to take 2000u Vit D daily supplement... ~  2/14: he is reminded to stay on the Men's formula MVI & VitD supplement daily...  Hx of IRON DEFICIENCY (ICD-280.9) - on OTC Fe supplement daily... ~  labs 1/10 showed Hg= 14.6, MCV= 91, Fe= 37 (sat=10%) ~  labs 1/11 showed Hg= 13.5, MCV= 93, Fe= 85 (sat=26%) ~  labs 2/12 showed Hg= 14.0, MCV= 90 ~  Labs 2/13 showed Hg= 13.9, MCV= 89 ~  Labs 2/14 showed Hg= 14.4 ~  Labs 9/14 showed Hg= 14.4   Past Surgical History  Procedure Laterality Date  . Right inguinal hernia repair as a child      Outpatient Encounter Prescriptions as of 04/23/2013  Medication Sig  . acyclovir (ZOVIRAX) 200 MG capsule Take 1 capsule (200 mg total) by mouth 2 (two) times daily.  Marland Kitchen acyclovir ointment (ZOVIRAX) 5 % 1 application every 3 hrs as needed  . chlorpheniramine-HYDROcodone (TUSSIONEX PENNKINETIC ER) 10-8 MG/5ML LQCR Take  5 mLs by mouth every 12 (twelve) hours.  . clotrimazole-betamethasone (LOTRISONE) cream APPLY TO RASH AS NEEDED  . Diphenhyd-Hydrocort-Nystatin (FIRST-DUKES MOUTHWASH) SUSP 1 tsp gargle and swallow four times daily as needed  . hydrocortisone (PROCTOSOL HC) 2.5 % rectal cream PLACE RECTALLY TWICE DAILY AS DIRECTED  . NEOMYCIN-POLYMYXIN-HYDROCORTISONE (CORTISPORIN) 1 % SOLN otic solution Place 2 drops into both ears 3 (three) times daily.  Marland Kitchen omeprazole (PRILOSEC) 40 MG capsule Take 1 capsule (40 mg total) by mouth daily. Take 30 minutes before dinner  .  polyethylene glycol (MIRALAX / GLYCOLAX) packet Take 17 g by mouth daily as needed.   . simvastatin (ZOCOR) 40 MG tablet TAKE 1/2  TABLET BY MOUTH AT BEDTIME  . Testosterone (FORTESTA) 10 MG/ACT (2%) GEL Apply 4 application topically daily. Apply onto skin daily as directed  . [DISCONTINUED] PROCTOSOL HC 2.5 % rectal cream APPLY RECTALLY 2 (TWO) TIMES DAILY.    Allergies  Allergen Reactions  . Codeine Phosphate     REACTION: nausea and vomiting    Current Medications, Allergies, Past Medical History, Past Surgical History, Family History, and Social History were reviewed in [employer] record.   Review of Systems    The patient denies fever, chills, sweats, anorexia, fatigue, weakness, malaise, weight loss, sleep disorder, blurring, diplopia, eye irritation, eye discharge, vision loss, eye pain, photophobia, earache, ear discharge, tinnitus, decreased hearing, nasal congestion, nosebleeds, sore throat, hoarseness, chest pain, palpitations, syncope, dyspnea on exertion, orthopnea, PND, peripheral edema, cough, dyspnea at rest, excessive sputum, hemoptysis, wheezing, pleurisy, nausea, vomiting, diarrhea, constipation, change in bowel habits, abdominal pain, melena, hematochezia, jaundice, gas/bloating, indigestion/heartburn, dysphagia, odynophagia, dysuria, hematuria, urinary frequency, urinary hesitancy, nocturia, incontinence, back pain, joint pain, joint swelling, muscle cramps, muscle weakness, stiffness, arthritis, sciatica, restless legs, leg pain at night, leg pain with exertion, rash, itching, dryness, suspicious lesions, paralysis, paresthesias, seizures, tremors, vertigo, transient blindness, frequent falls, frequent headaches, difficulty walking, depression, anxiety, memory loss, confusion, cold intolerance, heat intolerance, polydipsia, polyphagia, polyuria, unusual weight change, abnormal bruising, bleeding, enlarged lymph nodes, urticaria, allergic rash, hay fever,  and recurrent infections.     Objective:   Physical Exam    WD, Overweight, 48 y/o BM in NAD... GENERAL:  Alert & oriented; pleasant & cooperative... HEENT:  [place]/AT, EOM-wnl, PERRLA, EACs-clear, TMs-wnl, NOSE-clear, THROAT-clear & wnl. NECK:  Supple w/ full ROM; no JVD; normal carotid impulses w/o bruits; no thyromegaly or nodules palpated; no lymphadenopathy. CHEST:  Clear to P & A; without wheezes/ rales/ or rhonchi. HEART:  Regular Rhythm; without murmurs/ rubs/ or gallops. ABDOMEN:  Soft & nontender; normal bowel sounds; no organomegaly or masses detected. RECTAL:  Neg - prostate 2+ & min tender w/o nodules; testicles are atrophic; stool hematest neg. EXT: without deformities or arthritic changes; no varicose veins/ venous insuffic/ or edema. NEURO:  CN's intact; motor testing normal; sensory testing normal; gait normal & balance OK. DERM:  No lesions noted; no rash etc...  RADIOLOGY DATA:  Reviewed in the EPIC EMR & discussed w/ the patient...  LABORATORY DATA:  Reviewed in the EPIC EMR & discussed w/ the patient...   Assessment:      OSA/ RLS>  He prev refused repeat sleep study;  Now symptoms much better/ resolved w/ substantial wt reduction...  CHOL>  On Simva40-1/2 & FLP looks good; continue diet/ exercise/ get wt down...   Obesity>  Weight reduction is the key to several of his problems=> nice job w/ diet & exercise...  Constip/ mild rectal  stricture>  Discussed Miralax vs Senokot-S regularly...  Testos defic>  He denies symptoms & has remained on the [place]- 4 pumps daily...  Hx HSV2>  On Acyclovir suppression & uses zovirax ointment as needed...  Vit D Defic>  rec to restart his Vit D OTC supplement to 2000u daily...     Plan:     Patient's Medications  New Prescriptions   No medications on file  Previous Medications   CHLORPHENIRAMINE-HYDROCODONE (TUSSIONEX PENNKINETIC ER) 10-8 MG/5ML LQCR    Take 5 mLs by mouth every 12 (twelve) hours.   POLYETHYLENE  GLYCOL (MIRALAX / GLYCOLAX) PACKET    Take 17 g by mouth daily as needed.   Modified Medications   Modified Medication Previous Medication   ACYCLOVIR (ZOVIRAX) 200 MG CAPSULE acyclovir (ZOVIRAX) 200 MG capsule      Take 1 capsule (200 mg total) by mouth 2 (two) times daily.    Take 1 capsule (200 mg total) by mouth 2 (two) times daily.   ACYCLOVIR OINTMENT (ZOVIRAX) 5 % acyclovir ointment (ZOVIRAX) 5 %      1 application every 3 hrs as needed    1 application every 3 hrs as needed   CLOTRIMAZOLE-BETAMETHASONE (LOTRISONE) CREAM clotrimazole-betamethasone (LOTRISONE) cream      APPLY TO RASH AS NEEDED    APPLY TO RASH AS NEEDED   DIPHENHYD-HYDROCORT-NYSTATIN (FIRST-DUKES MOUTHWASH) SUSP Diphenhyd-Hydrocort-Nystatin (FIRST-DUKES MOUTHWASH) SUSP      1 tsp gargle and swallow four times daily as needed    1 tsp gargle and swallow four times daily as needed   HYDROCORTISONE (PROCTOSOL HC) 2.5 % RECTAL CREAM hydrocortisone (PROCTOSOL HC) 2.5 % rectal cream      PLACE RECTALLY TWICE DAILY AS DIRECTED    PLACE RECTALLY TWICE DAILY AS DIRECTED   NEOMYCIN-POLYMYXIN-HYDROCORTISONE (CORTISPORIN) 1 % SOLN OTIC SOLUTION NEOMYCIN-POLYMYXIN-HYDROCORTISONE (CORTISPORIN) 1 % SOLN otic solution      Place 2 drops into both ears 3 (three) times daily.    Place 2 drops into both ears 3 (three) times daily.   OMEPRAZOLE (PRILOSEC) 40 MG CAPSULE omeprazole (PRILOSEC) 40 MG capsule      Take 1 capsule (40 mg total) by mouth daily. Take 30 minutes before dinner    Take 1 capsule (40 mg total) by mouth daily. Take 30 minutes before dinner   SIMVASTATIN (ZOCOR) 40 MG TABLET simvastatin (ZOCOR) 40 MG tablet      TAKE 1/2  TABLET BY MOUTH AT BEDTIME    TAKE 1/2  TABLET BY MOUTH AT BEDTIME   TESTOSTERONE (FORTESTA) 10 MG/ACT (2%) GEL Testosterone (FORTESTA) 10 MG/ACT (2%) GEL      Apply 4 application topically daily. Apply onto skin daily as directed    Apply 4 application topically daily. Apply onto skin daily as directed   Discontinued Medications   PROCTOSOL HC 2.5 % RECTAL CREAM    APPLY RECTALLY 2 (TWO) TIMES DAILY.

## 2013-04-26 ENCOUNTER — Other Ambulatory Visit (INDEPENDENT_AMBULATORY_CARE_PROVIDER_SITE_OTHER): Payer: PRIVATE HEALTH INSURANCE

## 2013-04-26 DIAGNOSIS — E7800 Pure hypercholesterolemia, unspecified: Secondary | ICD-10-CM

## 2013-04-26 DIAGNOSIS — F411 Generalized anxiety disorder: Secondary | ICD-10-CM

## 2013-04-26 LAB — TSH: TSH: 1.19 u[IU]/mL (ref 0.35–5.50)

## 2013-04-26 LAB — LIPID PANEL
CHOLESTEROL: 132 mg/dL (ref 0–200)
HDL: 48.9 mg/dL (ref >39.00)
LDL CALC: 79 mg/dL (ref 0–99)
Total CHOL/HDL Ratio: 3
Triglycerides: 19 mg/dL (ref 0.0–149.0)
VLDL: 3.8 mg/dL (ref 0.0–40.0)

## 2013-05-11 ENCOUNTER — Other Ambulatory Visit: Payer: Self-pay | Admitting: Pulmonary Disease

## 2013-05-17 NOTE — Telephone Encounter (Signed)
Called spoke with pt. He repots he has not found a new PCP yet.  SN did already gave pt a refill on x 4 refills and [facility]. [facility] She reports to disregard this as they do have RX on file for pt. Nothing further needed

## 2013-10-17 ENCOUNTER — Telehealth: Payer: Self-pay | Admitting: Pulmonary Disease

## 2013-10-17 NOTE — Telephone Encounter (Signed)
Called spoke with pt. He cancelled appt with SN for tomorrow. He has pending appt in [facility] to see Dr. Drue Novel. Nothing further needed

## 2013-10-18 ENCOUNTER — Ambulatory Visit: Payer: PRIVATE HEALTH INSURANCE | Admitting: Pulmonary Disease

## 2013-10-22 ENCOUNTER — Encounter: Payer: Self-pay | Admitting: Internal Medicine

## 2013-10-22 ENCOUNTER — Telehealth: Payer: Self-pay

## 2013-10-22 ENCOUNTER — Telehealth: Payer: Self-pay | Admitting: Internal Medicine

## 2013-10-22 ENCOUNTER — Ambulatory Visit (INDEPENDENT_AMBULATORY_CARE_PROVIDER_SITE_OTHER): Payer: PRIVATE HEALTH INSURANCE | Admitting: Internal Medicine

## 2013-10-22 VITALS — BP 126/78 | HR 53 | Temp 97.8°F | Wt 231.0 lb

## 2013-10-22 DIAGNOSIS — R058 Other specified cough: Secondary | ICD-10-CM

## 2013-10-22 DIAGNOSIS — B009 Herpesviral infection, unspecified: Secondary | ICD-10-CM

## 2013-10-22 DIAGNOSIS — K649 Unspecified hemorrhoids: Secondary | ICD-10-CM

## 2013-10-22 DIAGNOSIS — Z23 Encounter for immunization: Secondary | ICD-10-CM

## 2013-10-22 DIAGNOSIS — G4733 Obstructive sleep apnea (adult) (pediatric): Secondary | ICD-10-CM

## 2013-10-22 DIAGNOSIS — G2581 Restless legs syndrome: Secondary | ICD-10-CM

## 2013-10-22 DIAGNOSIS — Z0000 Encounter for general adult medical examination without abnormal findings: Secondary | ICD-10-CM

## 2013-10-22 DIAGNOSIS — E291 Testicular hypofunction: Secondary | ICD-10-CM

## 2013-10-22 DIAGNOSIS — R05 Cough: Secondary | ICD-10-CM

## 2013-10-22 DIAGNOSIS — K5900 Constipation, unspecified: Secondary | ICD-10-CM

## 2013-10-22 DIAGNOSIS — E559 Vitamin D deficiency, unspecified: Secondary | ICD-10-CM

## 2013-10-22 MED ORDER — ACYCLOVIR 5 % EX OINT: TOPICAL_OINTMENT | CUTANEOUS | Status: DC

## 2013-10-22 MED ORDER — OMEPRAZOLE 40 MG PO CPDR: 40.0000 mg | DELAYED_RELEASE_CAPSULE | Freq: Every day | ORAL | Status: DC | PRN

## 2013-10-22 MED ORDER — ACYCLOVIR 200 MG PO CAPS: 200.0000 mg | ORAL_CAPSULE | Freq: Two times a day (BID) | ORAL | Status: DC

## 2013-10-22 MED ORDER — TESTOSTERONE 10 MG/ACT (2%) TD GEL: 4.0000 "application " | Freq: Every day | TRANSDERMAL | Status: DC

## 2013-10-22 MED ORDER — NEOMYCIN-POLYMYXIN-HC 1 % OT SOLN: 2.0000 [drp] | Freq: Three times a day (TID) | OTIC | Status: DC

## 2013-10-22 MED ORDER — POLYETHYLENE GLYCOL 3350 17 G PO PACK: 17.0000 g | PACK | Freq: Every day | ORAL | Status: DC | PRN

## 2013-10-22 MED ORDER — SIMVASTATIN 40 MG PO TABS: ORAL_TABLET | ORAL | Status: DC

## 2013-10-22 MED ORDER — HYDROCORTISONE 2.5 % RE CREA: TOPICAL_CREAM | RECTAL | Status: DC

## 2013-10-22 MED ORDER — FIRST-DUKES MOUTHWASH MT SUSP: OROMUCOSAL | Status: DC

## 2013-10-22 MED ORDER — HYDROCOD POLST-CHLORPHEN POLST 10-8 MG/5ML PO LQCR: 5.0000 mL | Freq: Two times a day (BID) | ORAL | Status: DC

## 2013-10-22 NOTE — Assessment & Plan Note (Signed)
Well controlled on MiraLax

## 2013-10-22 NOTE — Patient Instructions (Signed)
  Stop by the front desk and schedule labs to be done within few days (fasting), orders in the computer   Please come back to the office in 6 months  for a routine check up , no fasting

## 2013-10-22 NOTE — Assessment & Plan Note (Signed)
labs

## 2013-10-22 NOTE — Assessment & Plan Note (Signed)
sleep study 2006 showed sleep apnea and and signif leg jerks at some point used requip  as needed

## 2013-10-22 NOTE — Assessment & Plan Note (Addendum)
Low-T> known hx atrophic testes likely from mumps orchitis; on Fortesta- 4pumps/d  Good compliance  with HRT, DRE 03-2013 negative, check a testosterone level and PSA  He did report to his previous PCP that stamina- strength came back after testosterone replacement

## 2013-10-22 NOTE — Progress Notes (Signed)
Pre visit review using our clinic review tool, if applicable. No additional management support is needed unless otherwise documented below in the visit note. 

## 2013-10-22 NOTE — Assessment & Plan Note (Addendum)
Note from previous PCP reviewed: Hx OSA & RLS> sleep study 2006 w/ RDI=17 & mod snoring w/ signif leg jerks; never tried CPAP, has Requip 0.5mg  for RLS but only uses it "prn"; states snoring is gone w/ wt reduction & no leg symptoms

## 2013-10-22 NOTE — Telephone Encounter (Signed)
Caller name: Jaksen Relation to pt: self Call back number: [phone] Pharmacy:  Reason for call:   [facility] but acyclovir and acyclovir ointment need to go to [facility] on [address].  Patient would like callback when this has been corrected.

## 2013-10-22 NOTE — Progress Notes (Signed)
Subjective:    Patient ID: Gregory Santos, male    DOB: [birth year], 48 y.o.   MRN: [id number]  DOS:  10/22/2013 Type of visit - description : new pt, used to see Dr Kriste Basque for primary issues  In general feeling well, labs, medications and  previous notes from Dr. Kriste Basque reviewed   ROS No  CP, SOB No palpitations  Denies  nausea, vomiting diarrhea, blood in the stools (+) cough,chronic , on-off (-) wheezing, chest congestion No dysuria, gross hematuria, difficulty urinating   No anxiety, depression    Past Medical History  Diagnosis Date  . OSA (obstructive sleep apnea)   . Hypercholesteremia   . Obesity   . Hemorrhoids   . Constipation   . Testosterone deficiency   . HSV (herpes simplex virus) infection   . Headache(784.0)   . History of Bell's palsy   . Restless leg syndrome   . Anxiety   . Vitamin D deficiency   . History of iron deficiency   . BELLS PALSY 05/25/2007    Qualifier: History of  By: Kriste Basque MD, Lonzo Cloud     Past Surgical History  Procedure Laterality Date  . Right inguinal hernia repair as a child      History   Social History  . Marital Status: Married    Spouse Name: N/A    Number of Children: 0  . Years of Education: N/A   Occupational History  . [employer] , moving     Social History Main Topics  . Smoking status: Never Smoker   . Smokeless tobacco: Never Used  . Alcohol Use: No     Comment: rare   . Drug Use: No  . Sexual Activity: Not on file   Other Topics Concern  . Not on file   Social History Narrative   Lives w/ wife     Family History  Problem Relation Age of Onset  . Colon cancer Neg Hx   . Prostate cancer Neg Hx   . CAD Father     at a young age  . Stroke Other     GF (late onset)  . Diabetes Mother     Carole Binning       Medication List       This list is accurate as of: 10/22/13  5:28 PM.  Always use your most recent med list.               acyclovir 200 MG capsule  Commonly known as:  ZOVIRAX  Take 1  capsule (200 mg total) by mouth 2 (two) times daily.     acyclovir ointment 5 %  Commonly known as:  ZOVIRAX  1 application every 3 hrs as needed     chlorpheniramine-HYDROcodone 10-8 MG/5ML Lqcr  Commonly known as:  TUSSIONEX PENNKINETIC ER  Take 5 mLs by mouth every 12 (twelve) hours.     clotrimazole-betamethasone cream  Commonly known as:  LOTRISONE  APPLY TO RASH AS NEEDED     FIRST-DUKES MOUTHWASH Susp  1 tsp gargle and swallow four times daily as needed     hydrocortisone 2.5 % rectal cream  Commonly known as:  PROCTOSOL HC  PLACE RECTALLY TWICE DAILY AS DIRECTED     NEOMYCIN-POLYMYXIN-HYDROCORTISONE 1 % Soln otic solution  Commonly known as:  CORTISPORIN  Place 2 drops into both ears 3 (three) times daily.     omeprazole 40 MG capsule  Commonly known as:  PRILOSEC  Take 1  capsule (40 mg total) by mouth daily as needed. Take 30 minutes before dinner     polyethylene glycol packet  Commonly known as:  MIRALAX / GLYCOLAX  Take 17 g by mouth daily as needed.     simvastatin 40 MG tablet  Commonly known as:  ZOCOR  TAKE 1/2  TABLET BY MOUTH AT BEDTIME     Testosterone 10 MG/ACT (2%) Gel  Commonly known as:  FORTESTA  Apply 4 application topically daily. Apply onto skin daily as directed           Objective:   Physical Exam BP 126/78  Pulse 53  Temp(Src) 97.8 F (36.6 C) (Oral)  Wt 231 lb (104.781 kg)  SpO2 98% General -- alert, well-developed, NAD.  Neck --no thyromegaly , normal carotid pulse  HEENT-- Not pale.  Lungs -- normal respiratory effort, no intercostal retractions, no accessory muscle use, and normal breath sounds.  Heart-- normal rate, regular rhythm, no murmur.  Abdomen-- Not distended, good bowel sounds,soft, non-tender.  Extremities-- no pretibial edema bilaterally  Neurologic--  alert & oriented X3. Speech normal, gait appropriate for age, strength symmetric and appropriate for age.  Psych-- Cognition and judgment appear intact.  Cooperative with normal attention span and concentration. No anxious or depressed appearing.      Assessment & Plan:

## 2013-10-22 NOTE — Assessment & Plan Note (Signed)
On daily acyclovir, will discuss in the future possibly taking when necessary instead of qd

## 2013-10-22 NOTE — Telephone Encounter (Signed)
[facility] explained [facility] was [facility] by mistake, Acyclovir was resent to [facility], and informed Pt.

## 2013-10-22 NOTE — Assessment & Plan Note (Signed)
Td today Flu shot today cscope 2010 , Dr Christella Hartigan, 1 polyp, 10 years Labs PSA  Diet exercise discussed

## 2013-10-22 NOTE — Assessment & Plan Note (Signed)
External hemorrhoids, on and off symptoms, negative colonoscopy 2010

## 2013-10-22 NOTE — Assessment & Plan Note (Signed)
on codeine when necessary

## 2013-10-22 NOTE — Telephone Encounter (Signed)
[facility], they have [facility] BLM with lidocaine, benadryl. [facility] requesting [facility] to fill BLM instead of [facility].   Please advise.

## 2013-10-23 MED ORDER — FIRST-MOUTHWASH BLM MT SUSP: OROMUCOSAL | Status: DC

## 2013-10-23 NOTE — Telephone Encounter (Signed)
Spoke with Pt, was disconnected during phone conversation, refilled rx for HSV symptom relief?

## 2013-10-25 ENCOUNTER — Other Ambulatory Visit (INDEPENDENT_AMBULATORY_CARE_PROVIDER_SITE_OTHER): Payer: Commercial Managed Care - PPO

## 2013-10-25 DIAGNOSIS — B009 Herpesviral infection, unspecified: Secondary | ICD-10-CM

## 2013-10-25 DIAGNOSIS — E559 Vitamin D deficiency, unspecified: Secondary | ICD-10-CM

## 2013-10-25 DIAGNOSIS — Z0000 Encounter for general adult medical examination without abnormal findings: Secondary | ICD-10-CM | POA: Diagnosis not present

## 2013-10-25 DIAGNOSIS — E291 Testicular hypofunction: Secondary | ICD-10-CM

## 2013-10-25 LAB — COMPREHENSIVE METABOLIC PANEL
ALT: 14 U/L (ref 0–53)
AST: 24 U/L (ref 0–37)
Albumin: 4.7 g/dL (ref 3.5–5.2)
Alkaline Phosphatase: 49 U/L (ref 39–117)
BUN: 14 mg/dL (ref 6–23)
CO2: 27 mEq/L (ref 19–32)
Calcium: 9.3 mg/dL (ref 8.4–10.5)
Chloride: 103 mEq/L (ref 96–112)
Creatinine, Ser: 1 mg/dL (ref 0.4–1.5)
GFR: 108.17 mL/min (ref >60.00)
Glucose, Bld: 94 mg/dL (ref 70–99)
Potassium: 4.5 mEq/L (ref 3.5–5.1)
Sodium: 136 mEq/L (ref 135–145)
Total Bilirubin: 1 mg/dL (ref 0.2–1.2)
Total Protein: 8.3 g/dL (ref 6.0–8.3)

## 2013-10-25 LAB — LIPID PANEL
Cholesterol: 141 mg/dL (ref 0–200)
HDL: 44.6 mg/dL (ref >39.00)
LDL Cholesterol: 87 mg/dL (ref 0–99)
NonHDL: 96.4
Total CHOL/HDL Ratio: 3
Triglycerides: 46 mg/dL (ref 0.0–149.0)
VLDL: 9.2 mg/dL (ref 0.0–40.0)

## 2013-10-25 LAB — PSA: PSA: 0.53 ng/mL (ref 0.10–4.00)

## 2013-10-25 LAB — VITAMIN D 25 HYDROXY (VIT D DEFICIENCY, FRACTURES): VITD: 54.65 ng/mL (ref 30.00–100.00)

## 2013-10-29 ENCOUNTER — Telehealth: Payer: Self-pay | Admitting: Pulmonary Disease

## 2013-10-29 ENCOUNTER — Encounter: Payer: Self-pay | Admitting: *Deleted

## 2013-10-29 NOTE — Telephone Encounter (Signed)
Shanda BumpsJessica took the letter out to the pts wife and she explained to the pts wife that this letter may not be accepted by the pt to the court since this is normally sent in by the doctor and that it normally has to be sent in 10 days prior to the jury date.  pts wife is aware and she will turn this letter into the court system.

## 2013-10-29 NOTE — Telephone Encounter (Signed)
Letter has been completed and will have SN to sign and take out to the pts wife.

## 2013-11-01 LAB — TESTOSTERONE, FREE, TOTAL, SHBG
Sex Hormone Binding: 45 nmol/L (ref 13–71)
TESTOSTERONE FREE: 27.4 pg/mL — AB (ref 47.0–244.0)
TESTOSTERONE-% FREE: 1.5 % — AB (ref 1.6–2.9)
Testosterone: 177 ng/dL — ABNORMAL LOW (ref 300–890)

## 2013-11-07 MED ORDER — TESTOSTERONE 10 MG/ACT (2%) TD GEL: 5.0000 "application " | Freq: Every day | TRANSDERMAL | Status: DC

## 2013-11-29 ENCOUNTER — Ambulatory Visit: Payer: PRIVATE HEALTH INSURANCE | Admitting: Internal Medicine

## 2013-12-12 ENCOUNTER — Telehealth: Payer: Self-pay | Admitting: Pulmonary Disease

## 2013-12-12 NOTE — Telephone Encounter (Signed)
Called patient and he refused an appointment  Said he would have to live with his discomfort

## 2013-12-12 NOTE — Telephone Encounter (Signed)
HAS A SORE THROAT WANTS RX [facility] [address]  WANTS Z PACK AND MAGIC MOUTHWASH

## 2013-12-12 NOTE — Telephone Encounter (Signed)
He will need an appt for any antibiotics.

## 2014-02-07 ENCOUNTER — Other Ambulatory Visit: Payer: PRIVATE HEALTH INSURANCE

## 2014-02-12 ENCOUNTER — Telehealth: Payer: Self-pay | Admitting: *Deleted

## 2014-02-12 ENCOUNTER — Telehealth: Payer: Self-pay | Admitting: Internal Medicine

## 2014-02-12 NOTE — Telephone Encounter (Signed)
PA for fortesta initiated. Awaiting determination. JG//CMA

## 2014-02-12 NOTE — Telephone Encounter (Signed)
Please inform Pt, PA for Azucena Fre[place] initiated by Saintclair HalstedJessica G on 02/12/2014. Awaiting determination from his insurance. PAs may take a few days up to 2 weeks to receive determination. As soon as we hear back from his insurance either I or Saintclair HalstedJessica G will contact him.

## 2014-02-12 NOTE — Telephone Encounter (Signed)
Caller name: Onalee HuaDavid Relation to pt: self Call back number: [phone] [facility] [address]  Reason for call:   Patient has been trying to get refill for fortesta, this medication is now not covered under insurance and medication that is covered is androgel.   Patient states that the only rx that goes [facility] on [address] is the zoviraz and cream.

## 2014-02-13 NOTE — Telephone Encounter (Signed)
Informed patient of this.  °

## 2014-02-20 NOTE — Telephone Encounter (Signed)
Additional information faxed to insurance. JG//CMA

## 2014-02-22 ENCOUNTER — Telehealth: Payer: Self-pay | Admitting: Pulmonary Disease

## 2014-02-22 NOTE — Telephone Encounter (Signed)
error:[id number] ° °

## 2014-02-22 NOTE — Telephone Encounter (Signed)
Caller name: Elting,Stacie Relation to pt: spouse Call back number: [phone] Pharmacy:  Reason for call:  Spouse checking on the status of medication. Advise spouse of 02/20/14 telephone note. Spouse stressed spouse has been out of medication for 3 weeks.

## 2014-03-05 ENCOUNTER — Telehealth: Payer: Self-pay | Admitting: Pulmonary Disease

## 2014-03-05 NOTE — Telephone Encounter (Signed)
error:[id number] ° °

## 2014-03-07 ENCOUNTER — Other Ambulatory Visit: Payer: PRIVATE HEALTH INSURANCE

## 2014-03-08 ENCOUNTER — Telehealth: Payer: Self-pay | Admitting: Pulmonary Disease

## 2014-03-08 MED ORDER — TESTOSTERONE 12.5 MG/ACT (1%) TD GEL: TRANSDERMAL | Status: DC

## 2014-03-08 NOTE — Addendum Note (Signed)
Addended by: Amado CoeGLOVER, Halford Goetzke A on: 03/08/2014 01:57 PM   Modules accepted: Orders

## 2014-03-08 NOTE — Telephone Encounter (Signed)
error:[id number] ° °

## 2014-03-08 NOTE — Telephone Encounter (Signed)
Awaiting Dr. Leta JunglingPaz's response.

## 2014-03-08 NOTE — Telephone Encounter (Signed)
Pharmacy checking on the status of medication below. [id number] [name],  - [address] [phone] (Phone) [phone] (Fax)

## 2014-03-08 NOTE — Telephone Encounter (Signed)
PA denied for [place]. They will pay for Testosterone (ANDROGEL PUMP) 1.25 GM/ACT (1%) GEL, which patient was on previously. Is it ok to switch to this? I spoke with patient's wife and this is their preference. Please advise. JG//CMA

## 2014-03-10 NOTE — Telephone Encounter (Signed)
Okay to switch to AndroGel, 4 pumps daily. Needs an appointment by the end of March, please arrange. At that time will check his testosterone level

## 2014-03-12 NOTE — Telephone Encounter (Signed)
Patient wife calling in on this. Best # [phone]

## 2014-03-13 MED ORDER — TESTOSTERONE 12.5 MG/ACT (1%) TD GEL: TRANSDERMAL | Status: DC

## 2014-03-13 MED ORDER — TESTOSTERONE 4 MG/24HR TD PT24: 1.0000 | MEDICATED_PATCH | Freq: Every day | TRANSDERMAL | Status: DC

## 2014-03-13 NOTE — Addendum Note (Signed)
Addended by: Amado CoeGLOVER, Falicia Lizotte A on: 03/13/2014 07:16 AM   Modules accepted: Orders

## 2014-03-13 NOTE — Addendum Note (Signed)
Addended by: Willow OraPAZ, Adelheid Hoggard E on: 03/13/2014 12:48 PM   Modules accepted: Orders

## 2014-03-13 NOTE — Telephone Encounter (Signed)
Please refer to other phone note. JG//CMA

## 2014-03-13 NOTE — Telephone Encounter (Signed)
[facility] [address] JG//CMA

## 2014-03-13 NOTE — Addendum Note (Signed)
Addended by: Amado CoeGLOVER, Luisantonio Adinolfi A on: 03/13/2014 02:18 PM   Modules accepted: Orders

## 2014-03-13 NOTE — Telephone Encounter (Signed)
Prescription ready to be send

## 2014-03-13 NOTE — Telephone Encounter (Addendum)
Pharmacy is stating that the only covered Androderm patch. Ok to switch? Patient says this is ok with him. Please advise. JG//CMA

## 2014-03-13 NOTE — Telephone Encounter (Signed)
[facility]. Follow up appt scheduled for April 23, 2014. JG//CMA

## 2014-04-09 ENCOUNTER — Other Ambulatory Visit (INDEPENDENT_AMBULATORY_CARE_PROVIDER_SITE_OTHER): Payer: Commercial Managed Care - PPO

## 2014-04-09 DIAGNOSIS — E291 Testicular hypofunction: Secondary | ICD-10-CM

## 2014-04-10 LAB — TESTOSTERONE, FREE, TOTAL, SHBG
Sex Hormone Binding: 42 nmol/L (ref 10–50)
Testosterone, Free: 21.6 pg/mL — ABNORMAL LOW (ref 47.0–244.0)
Testosterone-% Free: 1.6 % (ref 1.6–2.9)
Testosterone: 135 ng/dL — ABNORMAL LOW (ref 300–890)

## 2014-04-11 ENCOUNTER — Telehealth: Payer: Self-pay | Admitting: Internal Medicine

## 2014-04-11 NOTE — Telephone Encounter (Signed)
Per Pt he has been taking it consistently again for 27 days. He has used 27 patches out of a 30 patch supply?

## 2014-04-11 NOTE — Telephone Encounter (Signed)
If he is not taking testosterone, no reason to recheck it. We can recheck 2 months after he use testosterone consistently

## 2014-04-11 NOTE — Telephone Encounter (Signed)
Please call the patient, his testosterone is very low, let me know what type of testosterone supplement is he using, what dose and compliance.

## 2014-04-11 NOTE — Telephone Encounter (Signed)
Spoke with Pt, he is using Testosterone patches. He informed me that his testosterone is probably low because he had been without the medication for several months because his insurance was not covering the medication and he did not have the money to pay out of pocket. Pt has F/U appt scheduled for 3/29 at 4 PM. Pt would like to know if we can recheck Testosterone then?

## 2014-04-12 NOTE — Telephone Encounter (Signed)
LMOM informing Pt that he and Dr. Drue NovelPaz can discuss testosterone results and medication at his next visit. Informed him to call if he had any further questions.

## 2014-04-12 NOTE — Telephone Encounter (Signed)
Will discuss on RTC, RF testost if needed

## 2014-04-22 ENCOUNTER — Other Ambulatory Visit: Payer: Self-pay

## 2014-04-23 ENCOUNTER — Encounter: Payer: Self-pay | Admitting: Internal Medicine

## 2014-04-23 ENCOUNTER — Ambulatory Visit (INDEPENDENT_AMBULATORY_CARE_PROVIDER_SITE_OTHER): Payer: Commercial Managed Care - PPO | Admitting: Internal Medicine

## 2014-04-23 VITALS — BP 124/66 | HR 62 | Temp 97.6°F | Ht 71.0 in | Wt 225.2 lb

## 2014-04-23 DIAGNOSIS — E7800 Pure hypercholesterolemia, unspecified: Secondary | ICD-10-CM

## 2014-04-23 DIAGNOSIS — E291 Testicular hypofunction: Secondary | ICD-10-CM | POA: Diagnosis not present

## 2014-04-23 MED ORDER — SIMVASTATIN 40 MG PO TABS: 20.0000 mg | ORAL_TABLET | Freq: Every day | ORAL | Status: DC

## 2014-04-23 MED ORDER — NEOMYCIN-POLYMYXIN-HC 1 % OT SOLN: 2.0000 [drp] | Freq: Three times a day (TID) | OTIC | Status: DC

## 2014-04-23 MED ORDER — CLOTRIMAZOLE-BETAMETHASONE 1-0.05 % EX CREA: TOPICAL_CREAM | CUTANEOUS | Status: DC

## 2014-04-23 MED ORDER — ACYCLOVIR 200 MG PO CAPS: 200.0000 mg | ORAL_CAPSULE | Freq: Two times a day (BID) | ORAL | Status: DC

## 2014-04-23 MED ORDER — ACYCLOVIR 5 % EX OINT: TOPICAL_OINTMENT | CUTANEOUS | Status: DC

## 2014-04-23 MED ORDER — POLYETHYLENE GLYCOL 3350 17 G PO PACK: 17.0000 g | PACK | Freq: Every day | ORAL | Status: DC | PRN

## 2014-04-23 NOTE — Patient Instructions (Addendum)
Please schedule labs to be done in one months: testosterone (total and free)    Come back to the office in 6 months  for a physical exam  Please schedule an appointment at the front desk    Come back fasting

## 2014-04-23 NOTE — Progress Notes (Signed)
Subjective:    Patient ID: Gregory Santos, male    DOB: 01/27/1965, 49 y.o.   MRN: [id number]  DOS:  04/23/2014 Type of visit - description : rov Interval history:  hypogonadism, due to insurance constraints, has not been taking HRT except for the last month. Has a spot at the right arm, the area has growth a little?. Denies pain or discharge Needs multiple refills.    Review of Systems  denies chest pain, difficulty breathing. No nausea, vomiting, diarrhea.  Past Medical History  Diagnosis Date  . OSA (obstructive sleep apnea)   . Hypercholesteremia   . Obesity   . Hemorrhoids   . Constipation   . Testosterone deficiency   . HSV (herpes simplex virus) infection   . Headache(784.0)   . History of Bell's palsy   . Restless leg syndrome   . Anxiety   . Vitamin D deficiency   . History of iron deficiency   . BELLS PALSY 05/25/2007    Qualifier: History of  By: Kriste Basque MD, Lonzo Cloud     Past Surgical History  Procedure Laterality Date  . Right inguinal hernia repair as a child      History   Social History  . Marital Status: Married    Spouse Name: N/A  . Number of Children: 0  . Years of Education: N/A   Occupational History  . [employer] , moving     Social History Main Topics  . Smoking status: Never Smoker   . Smokeless tobacco: Never Used  . Alcohol Use: No     Comment: rare   . Drug Use: No  . Sexual Activity: Not on file   Other Topics Concern  . Not on file   Social History Narrative   Lives w/ wife        Medication List       This list is accurate as of: 04/23/14 11:59 PM.  Always use your most recent med list.               acyclovir 200 MG capsule  Commonly known as:  ZOVIRAX  Take 1 capsule (200 mg total) by mouth 2 (two) times daily.     acyclovir ointment 5 %  Commonly known as:  ZOVIRAX  1 application every 3 hrs as needed     clotrimazole-betamethasone cream  Commonly known as:  LOTRISONE  APPLY TO RASH AS NEEDED     FIRST-MOUTHWASH BLM Susp  Use 1 tsp gargle and swallow four times daily as needed.     hydrocortisone 2.5 % rectal cream  Commonly known as:  PROCTOSOL HC  PLACE RECTALLY TWICE DAILY AS DIRECTED     NEOMYCIN-POLYMYXIN-HYDROCORTISONE 1 % Soln otic solution  Commonly known as:  CORTISPORIN  Place 2 drops into both ears 3 (three) times daily.     omeprazole 40 MG capsule  Commonly known as:  PRILOSEC  Take 1 capsule (40 mg total) by mouth daily as needed. Take 30 minutes before dinner     polyethylene glycol packet  Commonly known as:  MIRALAX / GLYCOLAX  Take 17 g by mouth daily as needed.     simvastatin 40 MG tablet  Commonly known as:  ZOCOR  Take 0.5 tablets (20 mg total) by mouth at bedtime.     testosterone 4 MG/24HR Pt24 patch  Commonly known as:  ANDRODERM  Place 1 patch onto the skin daily.           Objective:  Physical Exam  Constitutional: He is oriented to person, place, and time. He appears well-developed and well-nourished. No distress.  Neurological: He is alert and oriented to person, place, and time.  Skin: He is not diaphoretic.     Psychiatric: He has a normal mood and affect. His behavior is normal. Judgment and thought content normal.   BP 124/66 mmHg  Pulse 62  Temp(Src) 97.6 F (36.4 C) (Oral)  Ht 5\' 11"  (1.803 m)  Wt 225 lb 4 oz (102.173 kg)  BMI 31.43 kg/m2  SpO2 98%       Assessment & Plan:   SQ mas, likely a sebaceous cyst, if he likes to know w/ 100% certainty what it is: needs excision, he  elected observation

## 2014-04-23 NOTE — Assessment & Plan Note (Signed)
Off HRT d/t insurance problems, restarted a month ago, using one patch daily.  Plan-- cont HRT, labs in 1 month

## 2014-04-23 NOTE — Assessment & Plan Note (Signed)
Good med compliance, last FLP very good, rf meds

## 2014-04-23 NOTE — Progress Notes (Signed)
Pre visit review using our clinic review tool, if applicable. No additional management support is needed unless otherwise documented below in the visit note. 

## 2014-05-28 IMAGING — CR DG CHEST 2V
2 series · 2 of 2 positions shown · non-contrast
Comparison: 03/04/2010.

CLINICAL DATA: Recurrent cough.

CHEST - 2 VIEW

[view not recorded (1 of 2)]
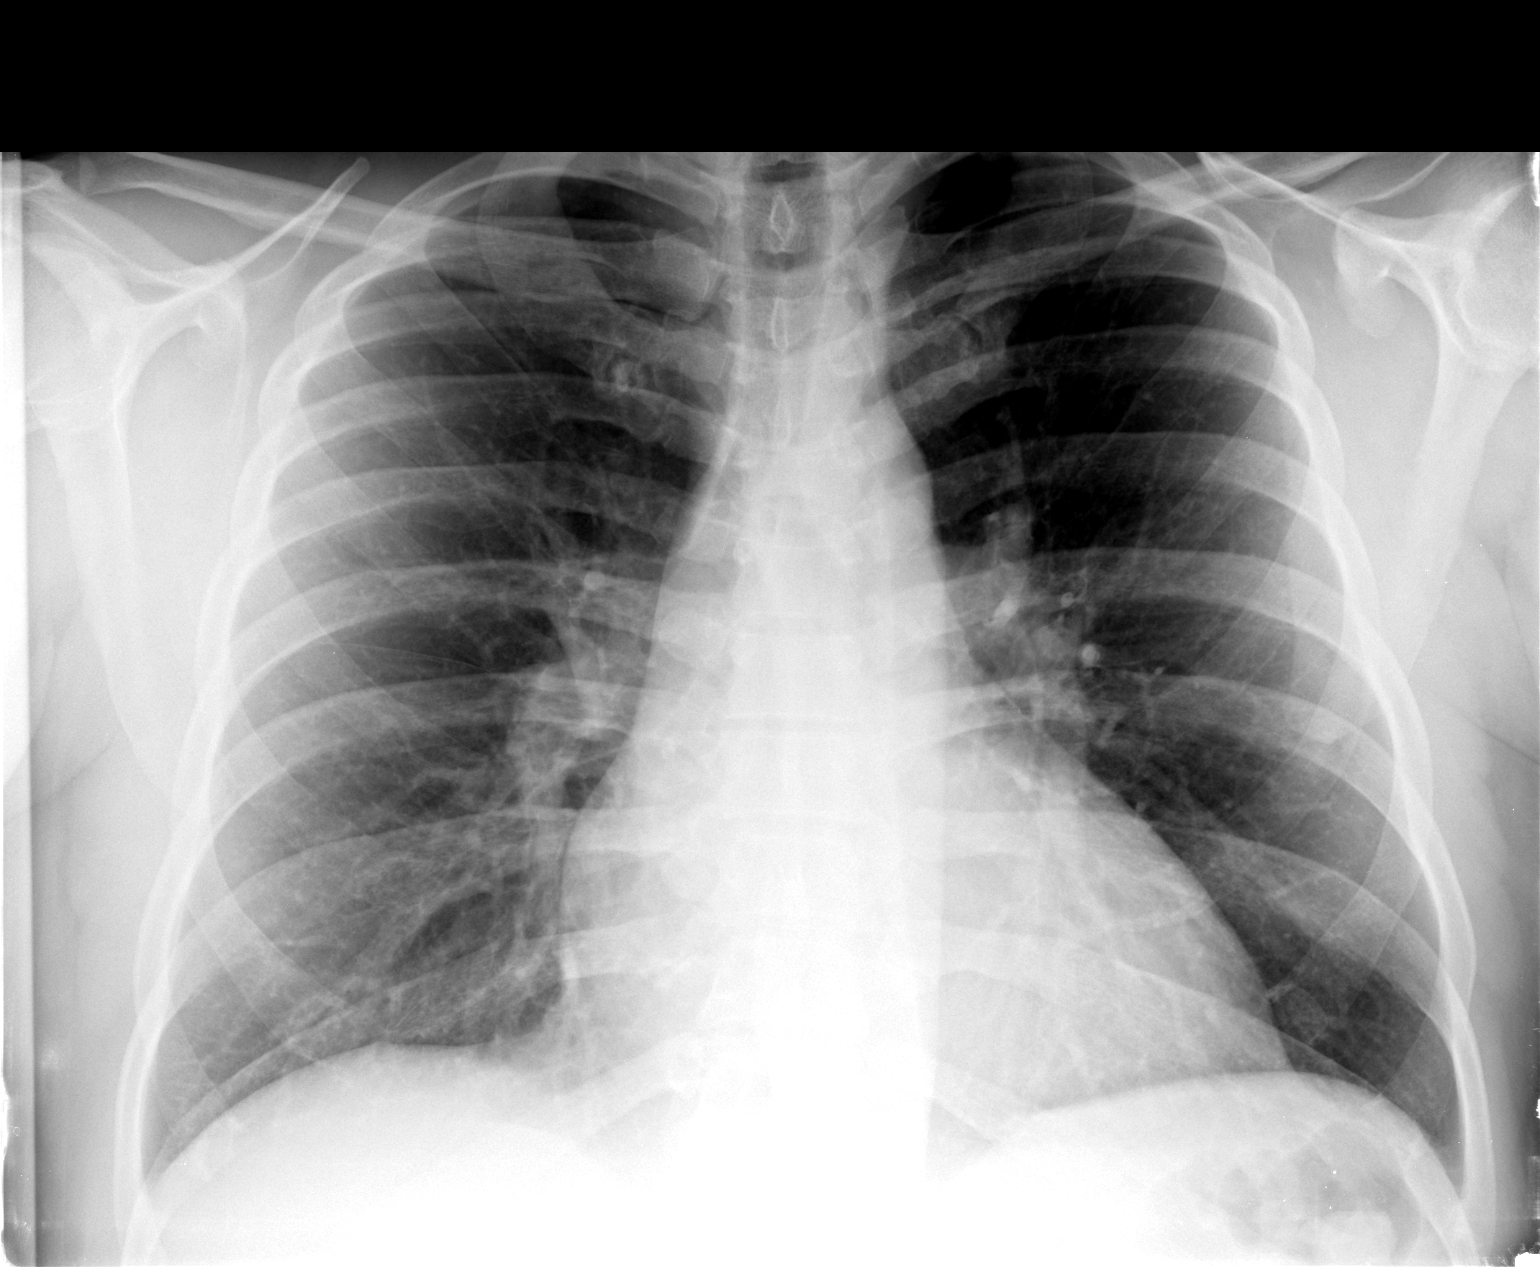

[view not recorded (2 of 2)]
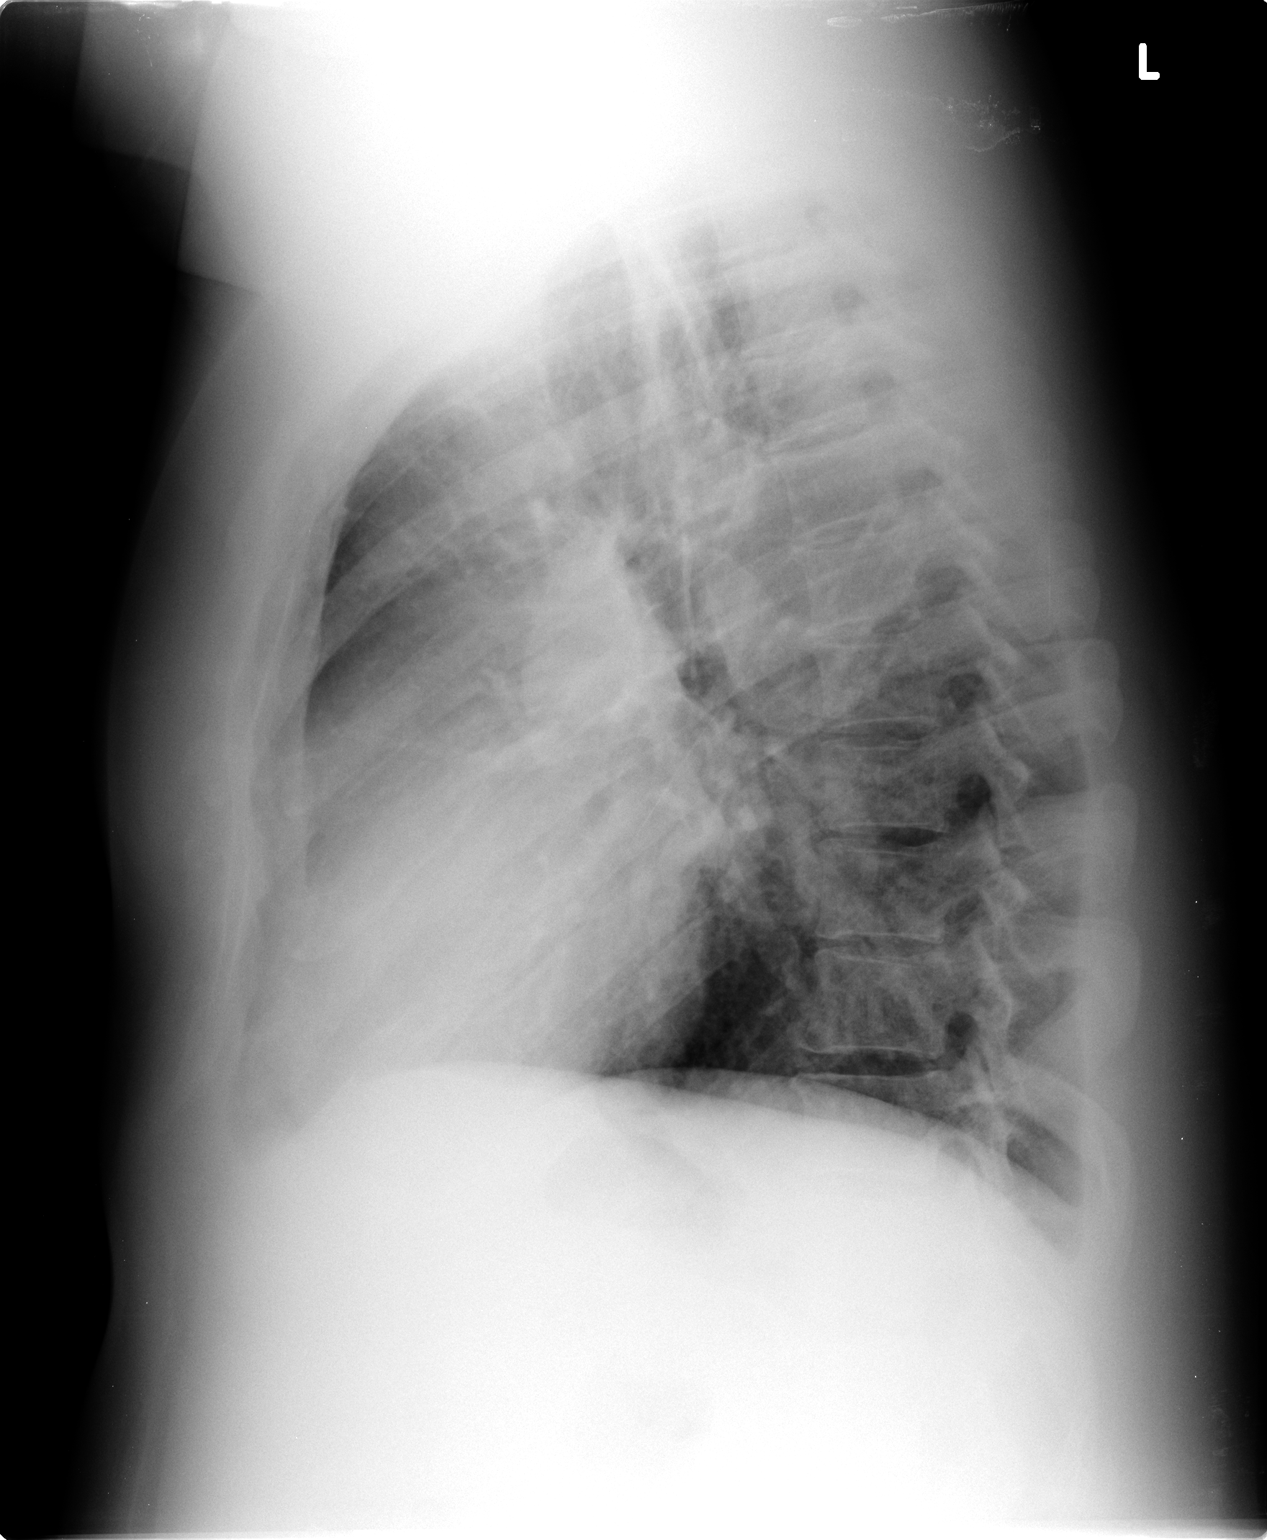

[2 of 2 positions shown; findings below may reference images not displayed]

FINDINGS: Trachea is midline.  Heart size stable. Pulmonary
arteries appear prominent.  Lungs are clear.  No pleural fluid.
IMPRESSION: No acute findings.

## 2014-07-04 ENCOUNTER — Other Ambulatory Visit: Payer: Self-pay | Admitting: Pulmonary Disease

## 2014-07-04 ENCOUNTER — Telehealth: Payer: Self-pay | Admitting: Internal Medicine

## 2014-07-04 NOTE — Telephone Encounter (Signed)
Caller name:Gregory Santos Relationship to patient:self  Can be reached:[phone] [facility] [address]   Reason for call:proctocol needs a refill called in

## 2014-07-04 NOTE — Telephone Encounter (Signed)
[facility], [facility] informed me that Pt had never filled the Hydrocortisone rectal cream that we sent in 09/2013. I requested that they fill that for the Pt and informed them that I would let Pt know. I called and spoke with Pt and informed him that the pharmacy is filling it for him. He stated that he spoke with them earlier also and they informed him that he did not have any refills on file. I informed him that I wasn't sure but that they should be filling it for him. Pt verbalized understanding.

## 2014-07-05 ENCOUNTER — Other Ambulatory Visit: Payer: Self-pay

## 2014-07-08 ENCOUNTER — Encounter: Payer: Self-pay | Admitting: Internal Medicine

## 2014-07-08 ENCOUNTER — Ambulatory Visit (INDEPENDENT_AMBULATORY_CARE_PROVIDER_SITE_OTHER): Payer: Commercial Managed Care - PPO | Admitting: Internal Medicine

## 2014-07-08 ENCOUNTER — Other Ambulatory Visit: Payer: Commercial Managed Care - PPO

## 2014-07-08 VITALS — BP 128/80 | HR 51 | Temp 97.8°F | Ht 71.0 in | Wt 225.4 lb

## 2014-07-08 DIAGNOSIS — E291 Testicular hypofunction: Secondary | ICD-10-CM

## 2014-07-08 DIAGNOSIS — Z113 Encounter for screening for infections with a predominantly sexual mode of transmission: Secondary | ICD-10-CM | POA: Diagnosis not present

## 2014-07-08 MED ORDER — TESTOSTERONE 4 MG/24HR TD PT24: 1.0000 | MEDICATED_PATCH | Freq: Every day | TRANSDERMAL | Status: DC

## 2014-07-08 MED ORDER — OMEPRAZOLE 40 MG PO CPDR: 40.0000 mg | DELAYED_RELEASE_CAPSULE | Freq: Every day | ORAL | Status: DC | PRN

## 2014-07-08 NOTE — Patient Instructions (Signed)
Get your blood work before you leave    

## 2014-07-08 NOTE — Assessment & Plan Note (Addendum)
His insurance approved  HRT, using 1 patch qd x last 6 weeks Check labs On exam today, I was able to only find one soft, 1.5 cm sized testicle on the left.

## 2014-07-08 NOTE — Progress Notes (Signed)
Subjective:    Patient ID: Gregory Santos, male    DOB: August 24, 1965, 49 y.o.   MRN: [id number]  DOS:  07/08/2014 Type of visit - description : acute Interval history: Request STD screening,  Recently his sexual partner had bacterial vagintis Denies problems with hemorrhoids to me  On HRT for 6 weeks  Review of Systems  denies a genital rash, dysura, penile discharge.   Past Medical History  Diagnosis Date  . OSA (obstructive sleep apnea)   . Hypercholesteremia   . Obesity   . Hemorrhoids   . Constipation   . Testosterone deficiency   . HSV (herpes simplex virus) infection   . Headache(784.0)   . History of Bell's palsy   . Restless leg syndrome   . Anxiety   . Vitamin D deficiency   . History of iron deficiency   . BELLS PALSY 05/25/2007    Qualifier: History of  By: Kriste Basque MD, Lonzo Cloud     Past Surgical History  Procedure Laterality Date  . Right inguinal hernia repair as a child      History   Social History  . Marital Status: Married    Spouse Name: N/A  . Number of Children: 0  . Years of Education: N/A   Occupational History  . [employer] , moving     Social History Main Topics  . Smoking status: Never Smoker   . Smokeless tobacco: Never Used  . Alcohol Use: No     Comment: rare   . Drug Use: No  . Sexual Activity: Not on file   Other Topics Concern  . Not on file   Social History Narrative   Lives w/ wife        Medication List       This list is accurate as of: 07/08/14  6:00 PM.  Always use your most recent med list.               acyclovir 200 MG capsule  Commonly known as:  ZOVIRAX  Take 1 capsule (200 mg total) by mouth 2 (two) times daily.     acyclovir ointment 5 %  Commonly known as:  ZOVIRAX  1 application every 3 hrs as needed     clotrimazole-betamethasone cream  Commonly known as:  LOTRISONE  APPLY TO RASH AS NEEDED     hydrocortisone 2.5 % rectal cream  Commonly known as:  PROCTOSOL HC  PLACE RECTALLY TWICE DAILY AS  DIRECTED     NEOMYCIN-POLYMYXIN-HYDROCORTISONE 1 % Soln otic solution  Commonly known as:  CORTISPORIN  Place 2 drops into both ears 3 (three) times daily.     omeprazole 40 MG capsule  Commonly known as:  PRILOSEC  Take 1 capsule (40 mg total) by mouth daily as needed. Take 30 minutes before dinner     simvastatin 40 MG tablet  Commonly known as:  ZOCOR  Take 0.5 tablets (20 mg total) by mouth at bedtime.     testosterone 4 MG/24HR Pt24 patch  Commonly known as:  ANDRODERM  Place 1 patch onto the skin daily.           Objective:   Physical Exam BP 128/80 mmHg  Pulse 51  Temp(Src) 97.8 F (36.6 C) (Oral)  Ht  (1.803 m)  Wt 225 lb 6 oz (102.229 kg)  BMI 31.45 kg/m2  SpO2 98%  General:   Well developed, well nourished . NAD.  HEENT:  Normocephalic . Face symmetric, atraumatic GU-  penis normal, o ash no discharge Scrotal contents: I was able to palpate a small, left ,  nontender,1.5 cm, soft testicle  I couldn't find a right side testicle Skin: Not pale. Not jaundice Neurologic:  alert & oriented X3.  Speech normal, gait appropriate for age and unassisted Psych--  Cognition and judgment appear intact.  Cooperative with normal attention span and concentration.  Behavior appropriate. No anxious or depressed appearing.       Assessment & Plan:    STD screening: Check a HIV, RPR and a G&C safe sex discussed

## 2014-07-08 NOTE — Progress Notes (Signed)
Pre visit review using our clinic review tool, if applicable. No additional management support is needed unless otherwise documented below in the visit note. 

## 2014-07-09 LAB — TESTOSTERONE, FREE, TOTAL, SHBG
Sex Hormone Binding: 42 nmol/L (ref 10–50)
Testosterone, Free: 143.9 pg/mL (ref 47.0–244.0)
Testosterone-% Free: 1.9 % (ref 1.6–2.9)
Testosterone: 741 ng/dL (ref 300–890)

## 2014-07-09 LAB — GC/CHLAMYDIA PROBE AMP, URINE
Chlamydia, Swab/Urine, PCR: NEGATIVE
GC Probe Amp, Urine: NEGATIVE

## 2014-07-09 LAB — RPR

## 2014-07-09 LAB — HIV ANTIBODY (ROUTINE TESTING W REFLEX): HIV 1&2 Ab, 4th Generation: NONREACTIVE

## 2014-07-24 ENCOUNTER — Other Ambulatory Visit: Payer: Commercial Managed Care - PPO

## 2014-08-15 ENCOUNTER — Telehealth: Payer: Self-pay | Admitting: Internal Medicine

## 2014-08-15 NOTE — Telephone Encounter (Signed)
Caller name: Gigante,Stacie Relation to pt: spouse  Call back number: [phone] [id number] [name], [place] - [address] [phone] (Phone) [phone] (Fax)          Reason for call:  As per pharmacy testosterone (ANDRODERM) 4 MG/24HR PT24 patch in need of PA.  Claim # [id number]   Spouse states patient has 1 patch left

## 2014-08-15 NOTE — Telephone Encounter (Signed)
PA initiated. Awaiting determination from [employer]. JG//CMA

## 2014-08-15 NOTE — Telephone Encounter (Signed)
Patient states that he brought in a paper for Dr. Drue Novel to fill out and to send back to patients insurance. This form had to be signed and is related to this presription so this wouldn't need a PA. Patient states that he is needing this to be completed before the weekend and is requesting a callback.

## 2014-08-15 NOTE — Telephone Encounter (Signed)
Pt was seen on 07/08/2014 for hemorrhoids, he informed me that his insurance was not going the cover the Testosterone patches that Dr. Drue Novel had changed him to. I informed Pt to contact insurance and ask what their preferred medication is and let us know. I do not remember receiving paperwork from Pt that day and nothing is noted in chart from that visit by either Dr. Drue Novel or me.

## 2014-08-16 NOTE — Telephone Encounter (Signed)
[employer] and they state the PA is processing. Requested expedited review.

## 2014-08-16 NOTE — Telephone Encounter (Signed)
LMOM at home number for Pt and Pt's wife Gregory Santos, informed them that we tried calling insurance to expedite the PA process. Informed them that per chart we didn't receive paperwork from Pt at last office visit, or last 3 office visits. I apologized if we possibly misplaced to form but again nothing was charted in his chart about receiving form. Voicemail cut off before I was able to complete voicemail. When I called back there was no answer, or voicemail did not pick up.

## 2014-08-16 NOTE — Telephone Encounter (Signed)
I don't remember form specifically. If they send me one I'll sign it right away

## 2014-08-16 NOTE — Telephone Encounter (Signed)
Caller name: Crutchfield,Stacie Relation to WU:[id number]  Call back number: [phone]   Reason for call:   Gregory Santos- Spouse would like to speak with you or MD directly regarding "insurance form" that was brought with patient at the last office visit, patient was admant about speaking with only you or MD today. Spouse states if MD filled out form prior-auth would not be needed in the future. Spouse states patient is completely out of patches.   Shanda Bumps- Spouse requesting if PA can be expedidited regarding testosterone (ANDRODERM) 4 MG/24HR PT24 patch

## 2014-08-16 NOTE — Telephone Encounter (Signed)
Do you remember receiving form for this Pt's Testosterone patches at last visit. Per last 3 office visits, nothing has been received.

## 2014-08-16 NOTE — Telephone Encounter (Signed)
We never received any form from Pt at last visit. Nothing has been noted in his chart or that office visit about receiving a form, either Dr. Drue Novel or myself would have documented in the office visit if we received anything which was not done at last OV. However, I will try contacting Pt and/or his wife to inform them as such.

## 2014-08-19 ENCOUNTER — Telehealth: Payer: Self-pay | Admitting: Internal Medicine

## 2014-08-19 NOTE — Telephone Encounter (Signed)
Caller name: Stacie Relation to pt: Wife Call back number: [phone] [facility]  Reason for call: Pt's stated spoke with insurance and they gave him the ok to pick up rx for now but does still need the paperwork to be done for his next refill. Please continue the process of paperwork on tel message of 08-15-14. Pt's wife states also the insurance has a direct number where to call from 7 am to 11 pm central time [phone]. Thank you

## 2014-08-19 NOTE — Telephone Encounter (Signed)
Spouse called back and stated please do not call home # try mobile # [phone] and #[phone]

## 2014-08-19 NOTE — Telephone Encounter (Signed)
Pt 's wife Darnelle Maffucci called back stating that pt is urgent needing his testosterone patches and that they have not received a call yet, pt  has been out of his patches  since Friday 08-16-14. Stacie was angry and is wanting to speak with someone today since this situation has been going on for a wk. The contact # to call first is the [phone] if no answer can call # [phone]. Please advise ASAP

## 2014-08-19 NOTE — Telephone Encounter (Signed)
PA is in review with his insurance. Per Dr. Drue Novel we never received form at last visit (which I left on voicemail on Friday). Saintclair Halsted contacted his insurance on Friday as well to try to expedite the PA process. Unfortuanately there is nothing further we can do until we hear back from his insurance, if we keep calling his insurance company it will keep putting the PA in further processing extending the date we may receive the approval or refusal. Please apologize to them for the inconvenience. Thank you.

## 2014-08-19 NOTE — Telephone Encounter (Signed)
Called pt informed the below, pt understood and was ok with it. Pt's wife Darnelle Maffucci states as soon the PA is in with the insurance and that we hear a word for it to please let them know so that they can get the patches with there insurance covering it. Thank you.

## 2014-08-19 NOTE — Telephone Encounter (Signed)
Noted  

## 2014-08-20 NOTE — Telephone Encounter (Signed)
PA is still processing.

## 2014-09-16 ENCOUNTER — Telehealth: Payer: Self-pay | Admitting: Internal Medicine

## 2014-09-16 NOTE — Telephone Encounter (Signed)
Caller name: Stacie  Relation to pt: wife  Call back number: [phone]   Reason for call:  Wife calling back checking on the status of PA, advised spouse PA may take 5 to 7 business days. Wife does not understand why PA is needed every few months. Advised patient to contact insurance to find out why medication is needing a prior auth every refill request. Spouse voice understanding and would like a call when PA is completed.

## 2014-09-16 NOTE — Telephone Encounter (Signed)
Caller name: Stacie Relationship to patient: wife Can be reached: [phone] [facility]  Reason for call: Pharmacy told them Androderm is requiring prior auth. She said they had this issue last month and they thought we had it fixed. She said it was done in March as well. She doesn't understand why this is having to be done monthly. She said pt will need it in a couple days and the last couple times it's taken a long time to where she got a person at her job involved to get the issue taken care of. Please take care of asap and notify her when completed.

## 2014-09-16 NOTE — Telephone Encounter (Signed)
Called and initiated PA over the phone. All clinical questions were answered. PA rep informed me that PA is still processing. I requested an expedited decision. JG//CMA

## 2014-09-18 NOTE — Telephone Encounter (Signed)
Approved effective 09/16/2014 through 09/16/2015. JG//CMA

## 2014-11-19 ENCOUNTER — Telehealth: Payer: Self-pay | Admitting: Internal Medicine

## 2014-11-19 NOTE — Telephone Encounter (Signed)
Please advise 

## 2014-11-19 NOTE — Telephone Encounter (Signed)
Spoke with Pt, informed him of labs were are going to test. Pt verbalized understanding. Pt wanted to let Dr. Drue NovelPaz know that he does NOT want a rectal exam for his prostate. Informed him I would let Dr. Drue NovelPaz know.

## 2014-11-19 NOTE — Telephone Encounter (Signed)
Caller name: Naida SleightDavid Santos   Relationship to patient: Self   Can be reached: [phone]   Reason for call: Pt is requesting a call back to see which labs are going to be drawn during his CPE appt tomorrow. Pt says that he just had labs completed in June. Please advise further.     Thanks.

## 2014-11-19 NOTE — Telephone Encounter (Signed)
CMP, FLP, CBC, PSA 

## 2014-11-20 ENCOUNTER — Encounter: Payer: Self-pay | Admitting: Internal Medicine

## 2014-11-20 ENCOUNTER — Ambulatory Visit (INDEPENDENT_AMBULATORY_CARE_PROVIDER_SITE_OTHER): Payer: Commercial Managed Care - PPO | Admitting: Internal Medicine

## 2014-11-20 VITALS — BP 122/76 | HR 62 | Temp 98.1°F | Ht 71.0 in | Wt 230.5 lb

## 2014-11-20 DIAGNOSIS — Z0000 Encounter for general adult medical examination without abnormal findings: Secondary | ICD-10-CM | POA: Diagnosis not present

## 2014-11-20 DIAGNOSIS — Z09 Encounter for follow-up examination after completed treatment for conditions other than malignant neoplasm: Secondary | ICD-10-CM

## 2014-11-20 DIAGNOSIS — Z125 Encounter for screening for malignant neoplasm of prostate: Secondary | ICD-10-CM | POA: Diagnosis not present

## 2014-11-20 DIAGNOSIS — E291 Testicular hypofunction: Secondary | ICD-10-CM

## 2014-11-20 LAB — COMPREHENSIVE METABOLIC PANEL
ALK PHOS: 52 U/L (ref 39–117)
ALT: 15 U/L (ref 0–53)
AST: 19 U/L (ref 0–37)
Albumin: 4.4 g/dL (ref 3.5–5.2)
BUN: 12 mg/dL (ref 6–23)
CALCIUM: 9.5 mg/dL (ref 8.4–10.5)
CO2: 29 meq/L (ref 19–32)
Chloride: 104 mEq/L (ref 96–112)
Creatinine, Ser: 0.96 mg/dL (ref 0.40–1.50)
GFR: 107.68 mL/min (ref >60.00)
Glucose, Bld: 104 mg/dL — ABNORMAL HIGH (ref 70–99)
Potassium: 4.1 mEq/L (ref 3.5–5.1)
Sodium: 140 mEq/L (ref 135–145)
Total Bilirubin: 0.5 mg/dL (ref 0.2–1.2)
Total Protein: 7.1 g/dL (ref 6.0–8.3)

## 2014-11-20 LAB — CBC WITH DIFFERENTIAL/PLATELET
BASOS PCT: 0.5 % (ref 0.0–3.0)
Basophils Absolute: 0 10*3/uL (ref 0.0–0.1)
EOS PCT: 2.3 % (ref 0.0–5.0)
Eosinophils Absolute: 0.2 10*3/uL (ref 0.0–0.7)
HEMATOCRIT: 41.1 % (ref 39.0–52.0)
Hemoglobin: 13.5 g/dL (ref 13.0–17.0)
LYMPHS ABS: 1.5 10*3/uL (ref 0.7–4.0)
Lymphocytes Relative: 21.1 % (ref 12.0–46.0)
MCHC: 32.9 g/dL (ref 30.0–36.0)
MCV: 89.9 fl (ref 78.0–100.0)
MONOS PCT: 4.5 % (ref 3.0–12.0)
Monocytes Absolute: 0.3 10*3/uL (ref 0.1–1.0)
Neutro Abs: 5.2 10*3/uL (ref 1.4–7.7)
Neutrophils Relative %: 71.6 % (ref 43.0–77.0)
PLATELETS: 319 10*3/uL (ref 150.0–400.0)
RBC: 4.57 Mil/uL (ref 4.22–5.81)
RDW: 13.9 % (ref 11.5–15.5)
WBC: 7.3 10*3/uL (ref 4.0–10.5)

## 2014-11-20 LAB — LIPID PANEL
CHOL/HDL RATIO: 3
Cholesterol: 132 mg/dL (ref 0–200)
HDL: 49.5 mg/dL (ref >39.00)
LDL Cholesterol: 75 mg/dL (ref 0–99)
NonHDL: 82.09
Triglycerides: 36 mg/dL (ref 0.0–149.0)
VLDL: 7.2 mg/dL (ref 0.0–40.0)

## 2014-11-20 LAB — PSA: PSA: 0.55 ng/mL (ref 0.10–4.00)

## 2014-11-20 MED ORDER — HYDROCORTISONE 2.5 % RE CREA: TOPICAL_CREAM | Freq: Two times a day (BID) | RECTAL | Status: DC | PRN

## 2014-11-20 MED ORDER — ACYCLOVIR 200 MG PO CAPS: 200.0000 mg | ORAL_CAPSULE | Freq: Two times a day (BID) | ORAL | Status: DC

## 2014-11-20 MED ORDER — CLOTRIMAZOLE-BETAMETHASONE 1-0.05 % EX CREA
TOPICAL_CREAM | Freq: Every day | CUTANEOUS | Status: DC | PRN
Start: 2014-11-20 — End: 2015-05-29

## 2014-11-20 MED ORDER — OMEPRAZOLE 40 MG PO CPDR: 40.0000 mg | DELAYED_RELEASE_CAPSULE | Freq: Every day | ORAL | Status: DC | PRN

## 2014-11-20 MED ORDER — NEOMYCIN-POLYMYXIN-HC 1 % OT SOLN: 2.0000 [drp] | Freq: Three times a day (TID) | OTIC | Status: DC

## 2014-11-20 MED ORDER — ACYCLOVIR 5 % EX OINT: TOPICAL_OINTMENT | CUTANEOUS | Status: DC | PRN

## 2014-11-20 NOTE — Patient Instructions (Signed)
Get your blood work before you leave     Next visit  for a    routine visit in 6 months, no need to be fasting. (15 minutes) Please schedule an appointment at the front desk

## 2014-11-20 NOTE — Progress Notes (Signed)
Pre visit review using our clinic review tool, if applicable. No additional management support is needed unless otherwise documented below in the visit note. 

## 2014-11-20 NOTE — Progress Notes (Signed)
Subjective:    Patient ID: Gregory Santos, male    DOB: [birth year], 49 y.o.   MRN: [id number]  DOS:  11/20/2014 Type of visit - description : Complete physical exam Interval history: Good compliance of medication, no major concerns. Feeling well. He remains active, goes to the gym frequently is trying to eat healthy    Review of Systems Constitutional: No fever. No chills. No unexplained wt changes. No unusual sweats  HEENT: No dental problems, no ear discharge, no facial swelling, no voice changes. No eye discharge, no eye  redness , no  intolerance to light   Respiratory: No wheezing , no  difficulty breathing. No cough , no mucus production  Cardiovascular: No CP, no leg swelling , no  Palpitations  GI: no nausea, no vomiting, no diarrhea , no  abdominal pain.  No blood in the stools. No dysphagia, no odynophagia    Endocrine: No polyphagia, no polyuria , no polydipsia  GU: No dysuria, gross hematuria, difficulty urinating. No urinary urgency, no frequency.  Musculoskeletal: No joint swellings or unusual aches or pains  Skin: No change in the color of the skin, palor , no  Rash  Allergic, immunologic: No environmental allergies , no  food allergies  Neurological: No dizziness no  syncope. No headaches. No diplopia, no slurred, no slurred speech, no motor deficits, no facial  Numbness  Hematological: No enlarged lymph nodes, no easy bruising , no unusual bleedings  Psychiatry: No suicidal ideas, no hallucinations, no beavior problems, no confusion.  No unusual/severe anxiety, no depression   Past Medical History  Diagnosis Date  . OSA (obstructive sleep apnea)   . Hypercholesteremia   . Obesity   . Hemorrhoids   . Constipation   . Testosterone deficiency   . HSV (herpes simplex virus) infection   . Headache(784.0)   . History of Bell's palsy   . Restless leg syndrome   . Anxiety   . Vitamin D deficiency   . History of iron deficiency   . BELLS PALSY  05/25/2007    Qualifier: History of  By: Kriste Basque MD, Lonzo Cloud     Past Surgical History  Procedure Laterality Date  . Right inguinal hernia repair as a child      Social History   Social History  . Marital Status: Married    Spouse Name: N/A  . Number of Children: 0  . Years of Education: N/A   Occupational History  . [employer] , moving     Social History Main Topics  . Smoking status: Never Smoker   . Smokeless tobacco: Never Used  . Alcohol Use: 0.0 oz/week    0 Standard drinks or equivalent per week     Comment: rare wine  . Drug Use: No  . Sexual Activity: Not on file   Other Topics Concern  . Not on file   Social History Narrative   Lives w/ wife     Family History  Problem Relation Age of Onset  . Colon cancer Neg Hx   . Prostate cancer Neg Hx   . CAD Father     at a young age  . Stroke Other     GF (late onset)  . Diabetes Mother     Carole Binning       Medication List       This list is accurate as of: 11/20/14  5:55 PM.  Always use your most recent med list.  acyclovir 200 MG capsule  Commonly known as:  ZOVIRAX  Take 1 capsule (200 mg total) by mouth 2 (two) times daily.     acyclovir ointment 5 %  Commonly known as:  ZOVIRAX  Apply topically every 3 (three) hours as needed.     clotrimazole-betamethasone cream  Commonly known as:  LOTRISONE  Apply topically daily as needed.     hydrocortisone 2.5 % rectal cream  Commonly known as:  PROCTOSOL HC  Place rectally 2 (two) times daily as needed for hemorrhoids or itching.     NEOMYCIN-POLYMYXIN-HYDROCORTISONE 1 % Soln otic solution  Commonly known as:  CORTISPORIN  Place 2 drops into both ears 3 (three) times daily.     omeprazole 40 MG capsule  Commonly known as:  PRILOSEC  Take 1 capsule (40 mg total) by mouth daily as needed. Take 30 minutes before dinner     simvastatin 40 MG tablet  Commonly known as:  ZOCOR  Take 0.5 tablets (20 mg total) by mouth at bedtime.      testosterone 4 MG/24HR Pt24 patch  Commonly known as:  ANDRODERM  Place 1 patch onto the skin daily.           Objective:   Physical Exam BP 122/76 mmHg  Pulse 62  Temp(Src) 98.1 F (36.7 C) (Oral)  Ht 5\' 11"  (1.803 m)  Wt 230 lb 8 oz (104.554 kg)  BMI 32.16 kg/m2  SpO2 96% General:   Well developed, well nourished . NAD.  Neck:  No thyromegaly HEENT:  Normocephalic . Face symmetric, atraumatic Lungs:  CTA B Normal respiratory effort, no intercostal retractions, no accessory muscle use. Heart: RRR,  no murmur.  No pretibial edema bilaterally  Abdomen:  Not distended, soft, non-tender. No rebound or rigidity.  Skin: Exposed areas without rash. Not pale. Not jaundice Neurologic:  alert & oriented X3.  Speech normal, gait appropriate for age and unassisted Strength symmetric and appropriate for age.  Psych: Cognition and judgment appear intact.  Cooperative with normal attention span and concentration.  Behavior appropriate. No anxious or depressed appearing.    Assessment & Plan:    assessment>  Hyperlipidemia OSA, RLS sleep study 2006 w/ RDI=17 & mod snoring w/ signif leg jerks; never tried CPAP, uses  Requip  "prn"; states snoring is gone w/ wt reduction  Obesity Low testosterone,  known hx atrophic testes likely from mumps orchitis, on HRT --> sx , improved subjectively Anxiety H/o Recurrent cough Hemorrhoids  H/o Vitamin D deficiency 2010 H/o Bell's palsy H/o HSV -- acyclovir qd  +FH CAD Father   Plan: Chronic medical problems seems well-controlled. Hemorrhoids: Using a steroid cream daily for years, recommend to use it only 2 or 3 times a week as needed. RTC 6 months

## 2014-11-20 NOTE — Assessment & Plan Note (Signed)
Td 2015 Had a Flu shot   cscope 2010 , Dr Christella HartiganJacobs, 1 polyp, 10 years Labs PSA , declined DRE  Diet exercise -- doing well

## 2014-11-20 NOTE — Assessment & Plan Note (Signed)
Chronic medical problems seems well-controlled. Hemorrhoids: Using a steroid cream daily for years, recommend to use it only 2 or 3 times a week as needed. RTC 6 months

## 2014-11-21 LAB — TESTOSTERONE, FREE, TOTAL, SHBG
SEX HORMONE BINDING: 42 nmol/L (ref 10–50)
TESTOSTERONE FREE: 105.1 pg/mL (ref 47.0–244.0)
TESTOSTERONE-% FREE: 1.8 % (ref 1.6–2.9)
TESTOSTERONE: 570 ng/dL (ref 300–890)

## 2014-12-02 ENCOUNTER — Telehealth: Payer: Self-pay | Admitting: Internal Medicine

## 2014-12-02 MED ORDER — CICLOPIROX 8 % EX SOLN: Freq: Every day | CUTANEOUS | Status: DC

## 2014-12-02 NOTE — Telephone Encounter (Signed)
Pt notified and made aware.  [facility] [address] per [facility]'s request.

## 2014-12-02 NOTE — Telephone Encounter (Signed)
I don't think I have looked at his nails but if he is sure he has a fungus, then ok to use Penlac daily until he see me next. Prescription sent.

## 2014-12-02 NOTE — Telephone Encounter (Signed)
Pt would like medication for toenail fungus.  States OTC meds have not helped.    Please advise.

## 2014-12-02 NOTE — Telephone Encounter (Signed)
Fax confirmation received. 

## 2014-12-02 NOTE — Telephone Encounter (Signed)
[facility] [address]  Pt is wanting a cream or spray for athletes foot. He has tried OTC and they don't work. He said he is having discoloration of the toenails. He does not have anything between toes or under toes.

## 2014-12-13 ENCOUNTER — Other Ambulatory Visit: Payer: Self-pay | Admitting: Internal Medicine

## 2015-01-16 ENCOUNTER — Other Ambulatory Visit: Payer: Self-pay | Admitting: Internal Medicine

## 2015-01-17 NOTE — Telephone Encounter (Signed)
Rx can only be Rx'ed for 6 months at a time. Rx printed, awaiting MD signature.

## 2015-01-17 NOTE — Telephone Encounter (Signed)
Ok x 1 year 

## 2015-01-17 NOTE — Telephone Encounter (Signed)
Pt is requesting refill on Androderm.  Last OV: 11/20/2014 Last Fill: 07/08/2014 #30 patch and 5 RF  Please advise.

## 2015-01-17 NOTE — Telephone Encounter (Signed)
[facility]  

## 2015-02-20 ENCOUNTER — Other Ambulatory Visit: Payer: Self-pay | Admitting: Internal Medicine

## 2015-05-21 ENCOUNTER — Ambulatory Visit: Payer: Commercial Managed Care - PPO | Admitting: Internal Medicine

## 2015-05-29 ENCOUNTER — Encounter: Payer: Self-pay | Admitting: Internal Medicine

## 2015-05-29 ENCOUNTER — Ambulatory Visit (INDEPENDENT_AMBULATORY_CARE_PROVIDER_SITE_OTHER): Payer: Commercial Managed Care - PPO | Admitting: Internal Medicine

## 2015-05-29 VITALS — BP 128/82 | HR 62 | Temp 98.4°F | Ht 71.0 in | Wt 231.1 lb

## 2015-05-29 DIAGNOSIS — E7800 Pure hypercholesterolemia, unspecified: Secondary | ICD-10-CM

## 2015-05-29 DIAGNOSIS — B009 Herpesviral infection, unspecified: Secondary | ICD-10-CM | POA: Diagnosis not present

## 2015-05-29 DIAGNOSIS — E291 Testicular hypofunction: Secondary | ICD-10-CM | POA: Diagnosis not present

## 2015-05-29 MED ORDER — SIMVASTATIN 40 MG PO TABS: 20.0000 mg | ORAL_TABLET | Freq: Every day | ORAL | Status: DC

## 2015-05-29 MED ORDER — CLOTRIMAZOLE-BETAMETHASONE 1-0.05 % EX CREA: TOPICAL_CREAM | Freq: Every day | CUTANEOUS | Status: DC | PRN

## 2015-05-29 MED ORDER — TESTOSTERONE 4 MG/24HR TD PT24: 1.0000 | MEDICATED_PATCH | Freq: Every day | TRANSDERMAL | Status: DC

## 2015-05-29 MED ORDER — CICLOPIROX 8 % EX SOLN: Freq: Every day | CUTANEOUS | Status: DC

## 2015-05-29 MED ORDER — ACYCLOVIR 5 % EX OINT: TOPICAL_OINTMENT | CUTANEOUS | Status: DC | PRN

## 2015-05-29 MED ORDER — ACYCLOVIR 200 MG PO CAPS: 200.0000 mg | ORAL_CAPSULE | Freq: Two times a day (BID) | ORAL | Status: DC

## 2015-05-29 MED ORDER — HYDROCORTISONE 2.5 % RE CREA: TOPICAL_CREAM | Freq: Two times a day (BID) | RECTAL | Status: DC | PRN

## 2015-05-29 MED ORDER — OMEPRAZOLE 40 MG PO CPDR: 40.0000 mg | DELAYED_RELEASE_CAPSULE | Freq: Every day | ORAL | Status: DC | PRN

## 2015-05-29 MED ORDER — NEOMYCIN-POLYMYXIN-HC 1 % OT SOLN: 2.0000 [drp] | Freq: Three times a day (TID) | OTIC | Status: DC

## 2015-05-29 NOTE — Progress Notes (Signed)
Pre visit review using our clinic review tool, if applicable. No additional management support is needed unless otherwise documented below in the visit note. 

## 2015-05-29 NOTE — Patient Instructions (Signed)
GO TO THE LAB : Get the blood work     GO TO THE FRONT DESK Schedule your next appointment for a physical exam in 6 months, fasting.    

## 2015-05-29 NOTE — Progress Notes (Signed)
Subjective:    Patient ID: Gregory Santos, male    DOB: Oct 26, 1965, 50 y.o.   MRN: [id number]  DOS:  05/29/2015 Type of visit - description : Routine office visit  Interval history:  Good med compliance , no apparent side effects, needs multiple refills. He is using testosterone supplements correctly.  Review of Systems  Denies chest pain or difficulty breathing No nausea, vomiting, diarrhea  Past Medical History  Diagnosis Date  . OSA (obstructive sleep apnea)   . Hypercholesteremia   . Obesity   . Hemorrhoids   . Constipation   . Testosterone deficiency   . HSV (herpes simplex virus) infection   . Headache(784.0)   . History of Bell's palsy   . Restless leg syndrome   . Anxiety   . Vitamin D deficiency   . History of iron deficiency   . BELLS PALSY 05/25/2007    Qualifier: History of  By: Kriste Basque MD, Lonzo Cloud     Past Surgical History  Procedure Laterality Date  . Right inguinal hernia repair as a child      Social History   Social History  . Marital Status: Married    Spouse Name: N/A  . Number of Children: 0  . Years of Education: N/A   Occupational History  . [employer] , moving     Social History Main Topics  . Smoking status: Never Smoker   . Smokeless tobacco: Never Used  . Alcohol Use: 0.0 oz/week    0 Standard drinks or equivalent per week     Comment: rare wine  . Drug Use: No  . Sexual Activity: Not on file   Other Topics Concern  . Not on file   Social History Narrative   Lives w/ wife        Medication List       This list is accurate as of: 05/29/15 11:59 PM.  Always use your most recent med list.               acyclovir 200 MG capsule  Commonly known as:  ZOVIRAX  Take 1 capsule (200 mg total) by mouth 2 (two) times daily.     acyclovir ointment 5 %  Commonly known as:  ZOVIRAX  Apply topically every 3 (three) hours as needed.     ciclopirox 8 % solution  Commonly known as:  PENLAC  Apply topically at bedtime. Apply over  nail and surrounding skin. Apply daily over previous coat. After seven (7) days, may remove with alcohol and continue cycle.     clotrimazole-betamethasone cream  Commonly known as:  LOTRISONE  Apply topically daily as needed.     hydrocortisone 2.5 % rectal cream  Commonly known as:  PROCTOSOL HC  Place rectally 2 (two) times daily as needed for hemorrhoids or itching.     NEOMYCIN-POLYMYXIN-HYDROCORTISONE 1 % Soln otic solution  Commonly known as:  CORTISPORIN  Place 2 drops into both ears 3 (three) times daily.     omeprazole 40 MG capsule  Commonly known as:  PRILOSEC  Take 1 capsule (40 mg total) by mouth daily as needed. Take 30 minutes before dinner     simvastatin 40 MG tablet  Commonly known as:  ZOCOR  Take 0.5 tablets (20 mg total) by mouth at bedtime.     testosterone 4 MG/24HR Pt24 patch  Commonly known as:  ANDRODERM  Place 1 patch onto the skin daily.  Objective:   Physical Exam BP 128/82 mmHg  Pulse 62  Temp(Src) 98.4 F (36.9 C) (Oral)  Ht 5\' 11"  (1.803 m)  Wt 231 lb 2 oz (104.838 kg)  BMI 32.25 kg/m2  SpO2 97% General:   Well developed, well nourished . NAD.  HEENT:  Normocephalic . Face symmetric, atraumatic Lungs:  CTA B Normal respiratory effort, no intercostal retractions, no accessory muscle use. Heart: RRR,  no murmur.  no pretibial edema bilaterally  Abdomen:  Not distended, soft, non-tender. No rebound or rigidity.  Skin: + Dystrophic nails, mostly at the great and fifth toes Neurologic:  alert & oriented X3.  Speech normal, gait appropriate for age and unassisted Psych--  Cognition and judgment appear intact.  Cooperative with normal attention span and concentration.  Behavior appropriate. No anxious or depressed appearing.    Assessment & Plan:   Assessment>  Hyperlipidemia Anxiety OSA, \sleep study 2006 w/ RDI=17 & mod snoring w/ signif leg jerks; never tried CPAP, uses  Requip  "prn"; states snoring is gone w/ wt  reduction  RLS Obesity Low testosterone (f/u pcp) -->   known hx atrophic testes likely from mumps orchitis, on HRT --> sx , improved subjectively H/o Recurrent cough H/o hemorrhoids  H/o Vitamin D deficiency 2010 H/o Bell's palsy H/o HSV -- acyclovir qd  Nail dystrophy_ penlac ,creams prn +FH CAD Father   Plan: Hyperlipidemia: Refill simvastatin, well-controlled Hypogonadism: Good compliance, refill medications, check a testosterone level. Last PSA satisfactory HSV infection: Refill acyclovir Dystrophic nails: Continue Penlac, creams. They were refilled RTC 6 months, CPX

## 2015-05-30 NOTE — Assessment & Plan Note (Signed)
Hyperlipidemia: Refill simvastatin, well-controlled Hypogonadism: Good compliance, refill medications, check a testosterone level. Last PSA satisfactory HSV infection: Refill acyclovir Dystrophic nails: Continue Penlac, creams. They were refilled RTC 6 months, CPX

## 2015-06-03 LAB — TESTOS,TOTAL,FREE AND SHBG (FEMALE)
SEX HORMONE BINDING GLOB.: 38 nmol/L (ref 10–50)
TESTOSTERONE,FREE: 42.9 pg/mL (ref 35.0–155.0)
TESTOSTERONE,TOTAL,LC/MS/MS: 352 ng/dL (ref 250–1100)

## 2015-06-18 ENCOUNTER — Encounter: Payer: Self-pay | Admitting: Gastroenterology

## 2015-07-22 ENCOUNTER — Telehealth: Payer: Self-pay | Admitting: Internal Medicine

## 2015-07-22 ENCOUNTER — Other Ambulatory Visit: Payer: Self-pay | Admitting: Internal Medicine

## 2015-07-22 MED ORDER — TESTOSTERONE 4 MG/24HR TD PT24: 1.0000 | MEDICATED_PATCH | Freq: Every day | TRANSDERMAL | Status: DC

## 2015-07-22 NOTE — Telephone Encounter (Signed)
°  Relationship to patient: [facility]  Can be reached: [phone]  Pharmacy:  [id number] [place], [place] - [address] [phone] (Phone) [phone] (Fax)         Reason for call: Needs call back in ref: to testosterone (ANDRODERM) 4 MG/24HR PT24 patch [[id number]][[id number]]

## 2015-07-22 NOTE — Telephone Encounter (Signed)
Received Rx refill request earlier today, 07/22/2015. Rx denied due to medication refilled per chart on 05/29/2015 #30 and 5RF. Was out on personal leave during these dates, unsure if Rx was sent to pharmacy. Rx re-printed, awaiting MD signature.

## 2015-07-22 NOTE — Telephone Encounter (Signed)
[facility]  

## 2015-10-01 ENCOUNTER — Telehealth: Payer: Self-pay

## 2015-10-02 NOTE — Telephone Encounter (Signed)
Received PA approval for Androderm through 09/30/2016. PA approval sent for scanning.

## 2015-10-02 NOTE — Telephone Encounter (Signed)
PA approval

## 2015-12-03 ENCOUNTER — Ambulatory Visit (INDEPENDENT_AMBULATORY_CARE_PROVIDER_SITE_OTHER): Payer: Commercial Managed Care - PPO | Admitting: Internal Medicine

## 2015-12-03 ENCOUNTER — Encounter: Payer: Self-pay | Admitting: Internal Medicine

## 2015-12-03 VITALS — BP 118/78 | HR 69 | Temp 98.1°F | Resp 14 | Ht 71.0 in | Wt 240.5 lb

## 2015-12-03 DIAGNOSIS — E291 Testicular hypofunction: Secondary | ICD-10-CM

## 2015-12-03 DIAGNOSIS — Z23 Encounter for immunization: Secondary | ICD-10-CM

## 2015-12-03 DIAGNOSIS — Z0000 Encounter for general adult medical examination without abnormal findings: Secondary | ICD-10-CM

## 2015-12-03 DIAGNOSIS — Z125 Encounter for screening for malignant neoplasm of prostate: Secondary | ICD-10-CM

## 2015-12-03 DIAGNOSIS — E559 Vitamin D deficiency, unspecified: Secondary | ICD-10-CM

## 2015-12-03 LAB — CBC WITH DIFFERENTIAL/PLATELET
Basophils Absolute: 0 10*3/uL (ref 0.0–0.1)
Basophils Relative: 0.4 % (ref 0.0–3.0)
EOS PCT: 3.1 % (ref 0.0–5.0)
Eosinophils Absolute: 0.2 10*3/uL (ref 0.0–0.7)
HEMATOCRIT: 41.7 % (ref 39.0–52.0)
HEMOGLOBIN: 13.9 g/dL (ref 13.0–17.0)
Lymphocytes Relative: 22.6 % (ref 12.0–46.0)
Lymphs Abs: 1.8 10*3/uL (ref 0.7–4.0)
MCHC: 33.4 g/dL (ref 30.0–36.0)
MCV: 88.7 fl (ref 78.0–100.0)
MONOS PCT: 6.7 % (ref 3.0–12.0)
Monocytes Absolute: 0.5 10*3/uL (ref 0.1–1.0)
Neutro Abs: 5.4 10*3/uL (ref 1.4–7.7)
Neutrophils Relative %: 67.2 % (ref 43.0–77.0)
Platelets: 333 10*3/uL (ref 150.0–400.0)
RBC: 4.7 Mil/uL (ref 4.22–5.81)
RDW: 13.7 % (ref 11.5–15.5)
WBC: 8 10*3/uL (ref 4.0–10.5)

## 2015-12-03 LAB — LIPID PANEL
Cholesterol: 157 mg/dL (ref 0–200)
HDL: 53.4 mg/dL (ref >39.00)
LDL Cholesterol: 94 mg/dL (ref 0–99)
NONHDL: 103.23
Total CHOL/HDL Ratio: 3
Triglycerides: 45 mg/dL (ref 0.0–149.0)
VLDL: 9 mg/dL (ref 0.0–40.0)

## 2015-12-03 LAB — BASIC METABOLIC PANEL
BUN: 10 mg/dL (ref 6–23)
CHLORIDE: 102 meq/L (ref 96–112)
CO2: 32 mEq/L (ref 19–32)
CREATININE: 0.89 mg/dL (ref 0.40–1.50)
Calcium: 9.9 mg/dL (ref 8.4–10.5)
GFR: 117 mL/min (ref >60.00)
Glucose, Bld: 104 mg/dL — ABNORMAL HIGH (ref 70–99)
Potassium: 4.1 mEq/L (ref 3.5–5.1)
Sodium: 140 mEq/L (ref 135–145)

## 2015-12-03 LAB — ALT: ALT: 14 U/L (ref 0–53)

## 2015-12-03 LAB — PSA: PSA: 0.97 ng/mL (ref 0.10–4.00)

## 2015-12-03 LAB — AST: AST: 18 U/L (ref 0–37)

## 2015-12-03 MED ORDER — HYDROCORTISONE 2.5 % RE CREA: TOPICAL_CREAM | Freq: Two times a day (BID) | RECTAL | 2 refills | Status: DC | PRN

## 2015-12-03 MED ORDER — TESTOSTERONE 4 MG/24HR TD PT24: 1.0000 | MEDICATED_PATCH | Freq: Every day | TRANSDERMAL | 3 refills | Status: DC

## 2015-12-03 MED ORDER — ACYCLOVIR 5 % EX OINT: TOPICAL_OINTMENT | CUTANEOUS | 3 refills | Status: DC | PRN

## 2015-12-03 MED ORDER — ACYCLOVIR 200 MG PO CAPS: 200.0000 mg | ORAL_CAPSULE | Freq: Two times a day (BID) | ORAL | 3 refills | Status: DC

## 2015-12-03 MED ORDER — CLOTRIMAZOLE-BETAMETHASONE 1-0.05 % EX CREA: TOPICAL_CREAM | Freq: Every day | CUTANEOUS | 3 refills | Status: DC | PRN

## 2015-12-03 MED ORDER — SIMVASTATIN 40 MG PO TABS: 20.0000 mg | ORAL_TABLET | Freq: Every day | ORAL | 3 refills | Status: DC

## 2015-12-03 MED ORDER — NEOMYCIN-POLYMYXIN-HC 1 % OT SOLN: 2.0000 [drp] | Freq: Three times a day (TID) | OTIC | 3 refills | Status: DC

## 2015-12-03 NOTE — Progress Notes (Signed)
Pre visit review using our clinic review tool, if applicable. No additional management support is needed unless otherwise documented below in the visit note. 

## 2015-12-03 NOTE — Progress Notes (Signed)
Subjective:    Patient ID: Gregory Santos, male    DOB: 01/20/1966, 50 y.o.   MRN: [id number]  DOS:  12/03/2015 Type of visit - description : cpx Interval history: No major concerns except that is not eating healthy and has gained some weight. Plans to do better. Good medication compliance  Wt Readings from Last 3 Encounters:  12/03/15 240 lb 8 oz (109.1 kg)  05/29/15 231 lb 2 oz (104.8 kg)  11/20/14 230 lb 8 oz (104.6 kg)     Review of Systems Constitutional: No fever. No chills. No unexplained wt changes. No unusual sweats  HEENT: No dental problems, no ear discharge, no facial swelling, no voice changes. No eye discharge, no eye  redness , no  intolerance to light   Respiratory: No wheezing , no  difficulty breathing. No cough , no mucus production  Cardiovascular: No CP, no leg swelling , no  Palpitations  GI: no nausea, no vomiting, no diarrhea , no  abdominal pain.  No blood in the stools. No dysphagia, no odynophagia    Endocrine: No polyphagia, no polyuria , no polydipsia  GU: No dysuria, gross hematuria, difficulty urinating. No urinary urgency, no frequency.  Musculoskeletal: No joint swellings or unusual aches or pains  Skin: No change in the color of the skin, palor , no  Rash  Allergic, immunologic: No environmental allergies , no  food allergies  Neurological: No dizziness no  syncope. No headaches. No diplopia, no slurred, no slurred speech, no motor deficits, no facial  Numbness  Hematological: No enlarged lymph nodes, no easy bruising , no unusual bleedings  Psychiatry: No suicidal ideas, no hallucinations, no beavior problems, no confusion.  No unusual/severe anxiety, no depression   Past Medical History:  Diagnosis Date  . Anxiety   . BELLS PALSY 05/25/2007   Qualifier: History of  By: Kriste BasqueNadel MD, Lonzo CloudScott M   . Constipation   . Headache(784.0)   . Hemorrhoids   . History of Bell's palsy   . History of iron deficiency   . HSV (herpes simplex  virus) infection   . Hypercholesteremia   . Obesity   . OSA (obstructive sleep apnea)   . Restless leg syndrome   . Testosterone deficiency   . Vitamin D deficiency     Past Surgical History:  Procedure Laterality Date  . right inguinal hernia repair as a child      Social History   Social History  . Marital status: Married    Spouse name: N/A  . Number of children: 0  . Years of education: N/A   Occupational History  . [employer] , moving     Social History Main Topics  . Smoking status: Never Smoker  . Smokeless tobacco: Never Used  . Alcohol use 0.0 oz/week     Comment: rare wine  . Drug use: No  . Sexual activity: Not on file   Other Topics Concern  . Not on file   Social History Narrative   Lives w/ wife     Family History  Problem Relation Age of Onset  . CAD Father     at a young age  . Diabetes Mother     Judie PetitM , Broth  . Stroke Other     GF (late onset)  . Colon cancer Neg Hx   . Prostate cancer Neg Hx    Family History  Problem Relation Age of Onset  . CAD Father     at a  young age  . Diabetes Mother     Judie PetitM , Broth  . Stroke Other     GF (late onset)  . Colon cancer Neg Hx   . Prostate cancer Neg Hx        Medication List       Accurate as of 12/03/15 11:59 PM. Always use your most recent med list.          acyclovir 200 MG capsule Commonly known as:  ZOVIRAX Take 1 capsule (200 mg total) by mouth 2 (two) times daily.   acyclovir ointment 5 % Commonly known as:  ZOVIRAX Apply topically every 3 (three) hours as needed.   clotrimazole-betamethasone cream Commonly known as:  LOTRISONE Apply topically daily as needed.   hydrocortisone 2.5 % rectal cream Commonly known as:  PROCTOSOL HC Place rectally 2 (two) times daily as needed for hemorrhoids or itching.   NEOMYCIN-POLYMYXIN-HYDROCORTISONE 1 % Soln otic solution Commonly known as:  CORTISPORIN Place 2 drops into both ears 3 (three) times daily.   simvastatin 40 MG  tablet Commonly known as:  ZOCOR Take 0.5 tablets (20 mg total) by mouth at bedtime.   testosterone 4 MG/24HR Pt24 patch Commonly known as:  ANDRODERM Place 1 patch onto the skin daily.          Objective:   Physical Exam  Skin:      BP 118/78 (BP Location: Left Arm, Patient Position: Sitting, Cuff Size: Normal)   Pulse 69   Temp 98.1 F (36.7 C) (Oral)   Resp 14   Ht 5\' 11"  (1.803 m)   Wt 240 lb 8 oz (109.1 kg)   SpO2 97%   BMI 33.54 kg/m   General:   Well developed, well nourished . NAD.  Neck: No  thyromegaly  HEENT:  Normocephalic . Face symmetric, atraumatic Lungs:  CTA B Normal respiratory effort, no intercostal retractions, no accessory muscle use. Heart: RRR,  no murmur.  No pretibial edema bilaterally  Abdomen:  Not distended, soft, non-tender. No rebound or rigidity.   Rectal:  External abnormalities: none. Normal sphincter tone. No rectal masses or tenderness.  Stool brown  Prostate: Exam limited by patient's size and discomfort for about the prostate gland seems normal.  Neurologic:  alert & oriented X3.  Speech normal, gait appropriate for age and unassisted Strength symmetric and appropriate for age.  Psych: Cognition and judgment appear intact.  Cooperative with normal attention span and concentration.  Behavior appropriate. No anxious or depressed appearing.    Assessment & Plan:  Assessment>  Hyperlipidemia Anxiety OSA, RLS----sleep study 2006 w/ RDI=17 & mod snoring w/ signif leg jerks; never tried CPAP, uses  Requip  "prn"; states snoring is gone w/ wt reduction  Obesity Low testosterone (f/u pcp) -->   known hx atrophic testes likely from mumps orchitis, on HRT --> sx , improved subjectively H/o Recurrent cough H/o hemorrhoids  H/o Vitamin D deficiency 2010 H/o Bell's palsy H/o HSV -- acyclovir qd  Nail dystrophy_ penlac ,creams prn +FH CAD Father   Plan: Hyperlipidemia: Continue simvastatin, checking labs Obesity: Has gained  weight, planning to do better Low testosterone: Good compliance with HRT, checking a PSA, CBC. Vit D deficiency: Check labs Lipoma, right arm: Patient somewhat concerned about it, findings benign, recommend observation, call if something changes. RTC 6 months.

## 2015-12-03 NOTE — Patient Instructions (Signed)
GO TO THE LAB : Get the blood work     GO TO THE FRONT DESK Schedule your next appointment for a   checkup in 6 months 

## 2015-12-03 NOTE — Assessment & Plan Note (Signed)
Td 2015;  Flu shot   cscope 2010 , Dr Christella HartiganJacobs, 1 polyp, 10 years On HRT: DRE limited but normal, checking labs. Labs --BMP, AST, ALT, FLP, CBC, testosterone, PSA, vitamin D Diet and exercise: Discuss

## 2015-12-04 NOTE — Assessment & Plan Note (Signed)
Hyperlipidemia: Continue simvastatin, checking labs Obesity: Has gained weight, planning to do better Low testosterone: Good compliance with HRT, checking a PSA, CBC. Vit D deficiency: Check labs Lipoma, right arm: Patient somewhat concerned about it, findings benign, recommend observation, call if something changes. RTC 6 months.

## 2015-12-06 LAB — VITAMIN D 1,25 DIHYDROXY
Vitamin D 1, 25 (OH)2 Total: 67 pg/mL (ref 18–72)
Vitamin D2 1, 25 (OH)2: <8 pg/mL
Vitamin D3 1, 25 (OH)2: 67 pg/mL

## 2015-12-10 LAB — TESTOS,TOTAL,FREE AND SHBG (FEMALE)
SEX HORMONE BINDING GLOB.: 40 nmol/L (ref 10–50)
TESTOSTERONE,FREE: 16 pg/mL — AB (ref 35.0–155.0)
TESTOSTERONE,TOTAL,LC/MS/MS: 143 ng/dL — AB (ref 250–1100)

## 2015-12-15 MED ORDER — TESTOSTERONE 2 MG/24HR TD PT24: 1.0000 | MEDICATED_PATCH | Freq: Every day | TRANSDERMAL | 3 refills | Status: DC

## 2015-12-15 MED ORDER — TESTOSTERONE 4 MG/24HR TD PT24: 1.0000 | MEDICATED_PATCH | Freq: Every day | TRANSDERMAL | 3 refills | Status: DC

## 2015-12-15 NOTE — Addendum Note (Signed)
Addended by: Conrad BurlingtonANTER, Merwin Breden D on: 12/15/2015 11:25 AM   Modules accepted: Orders

## 2015-12-15 NOTE — Addendum Note (Signed)
Addended by: Conrad BurlingtonANTER, Deonte Otting D on: 12/15/2015 11:22 AM   Modules accepted: Orders

## 2016-02-16 ENCOUNTER — Other Ambulatory Visit: Payer: Self-pay | Admitting: Internal Medicine

## 2016-02-18 ENCOUNTER — Other Ambulatory Visit: Payer: Self-pay | Admitting: Internal Medicine

## 2016-05-27 ENCOUNTER — Telehealth: Payer: Self-pay | Admitting: Internal Medicine

## 2016-05-27 NOTE — Telephone Encounter (Signed)
Pt is requesting refill on Androderm patches. Pt is on 6mg  patches total daily.  Last OV: 12/03/2015 Last Fill: on 2mg /24hr patch: 12/15/2015 #90 and 3 RF (However, can only keep 6 months at time on file due to controlled substance) Last Fill: on 4mg /24hr patch: 12/15/2015 #90 and 3RF (Again, can only keep 6 months at time of file due to controlled substance) UDS: Not needed for Androderm

## 2016-05-27 NOTE — Telephone Encounter (Signed)
Caller name: Fredia SorrowStacie Bigler Relationship to patient: wife Can be reached: [phone] Pharmacy: [facility]  Reason for call: Pt is needing order for Androderm sent to [facility]. Pt is on last one now.

## 2016-05-28 MED ORDER — TESTOSTERONE 4 MG/24HR TD PT24: 1.0000 | MEDICATED_PATCH | Freq: Every day | TRANSDERMAL | 1 refills | Status: DC

## 2016-05-28 MED ORDER — TESTOSTERONE 2 MG/24HR TD PT24: 1.0000 | MEDICATED_PATCH | Freq: Every day | TRANSDERMAL | 1 refills | Status: DC

## 2016-05-28 NOTE — Telephone Encounter (Signed)
Awaiting PCP approval on med refill.

## 2016-05-28 NOTE — Telephone Encounter (Signed)
Ok x 6 months 

## 2016-05-28 NOTE — Telephone Encounter (Signed)
[facility].

## 2016-05-28 NOTE — Telephone Encounter (Signed)
Pt's wife Gregory Santos(Stacie) called wanting to know about rx if [facility]- Please advise.

## 2016-05-28 NOTE — Telephone Encounter (Signed)
Rx's printed, awaiting MD signature.  

## 2016-06-02 ENCOUNTER — Ambulatory Visit: Payer: Commercial Managed Care - PPO | Admitting: Internal Medicine

## 2016-12-07 ENCOUNTER — Ambulatory Visit: Payer: Commercial Managed Care - PPO | Admitting: Internal Medicine

## 2016-12-07 ENCOUNTER — Encounter: Payer: Self-pay | Admitting: Internal Medicine

## 2016-12-07 VITALS — BP 128/78 | HR 79 | Temp 97.7°F | Resp 14 | Ht 71.0 in | Wt 231.0 lb

## 2016-12-07 DIAGNOSIS — E291 Testicular hypofunction: Secondary | ICD-10-CM | POA: Diagnosis not present

## 2016-12-07 DIAGNOSIS — Z23 Encounter for immunization: Secondary | ICD-10-CM

## 2016-12-07 DIAGNOSIS — E7800 Pure hypercholesterolemia, unspecified: Secondary | ICD-10-CM | POA: Diagnosis not present

## 2016-12-07 DIAGNOSIS — B009 Herpesviral infection, unspecified: Secondary | ICD-10-CM | POA: Diagnosis not present

## 2016-12-07 DIAGNOSIS — K5900 Constipation, unspecified: Secondary | ICD-10-CM | POA: Diagnosis not present

## 2016-12-07 LAB — CBC WITH DIFFERENTIAL/PLATELET
Basophils Absolute: 0 10*3/uL (ref 0.0–0.1)
Basophils Relative: 0.5 % (ref 0.0–3.0)
EOS ABS: 0.1 10*3/uL (ref 0.0–0.7)
Eosinophils Relative: 1.8 % (ref 0.0–5.0)
HCT: 42.8 % (ref 39.0–52.0)
HEMOGLOBIN: 14.4 g/dL (ref 13.0–17.0)
Lymphocytes Relative: 20.2 % (ref 12.0–46.0)
Lymphs Abs: 1.6 10*3/uL (ref 0.7–4.0)
MCHC: 33.6 g/dL (ref 30.0–36.0)
MCV: 90.2 fl (ref 78.0–100.0)
MONO ABS: 0.4 10*3/uL (ref 0.1–1.0)
Monocytes Relative: 5.3 % (ref 3.0–12.0)
NEUTROS PCT: 72.2 % (ref 43.0–77.0)
Neutro Abs: 5.6 10*3/uL (ref 1.4–7.7)
Platelets: 359 10*3/uL (ref 150.0–400.0)
RBC: 4.74 Mil/uL (ref 4.22–5.81)
RDW: 14.2 % (ref 11.5–15.5)
WBC: 7.7 10*3/uL (ref 4.0–10.5)

## 2016-12-07 LAB — COMPREHENSIVE METABOLIC PANEL
ALBUMIN: 4.4 g/dL (ref 3.5–5.2)
ALT: 10 U/L (ref 0–53)
AST: 15 U/L (ref 0–37)
Alkaline Phosphatase: 56 U/L (ref 39–117)
BUN: 10 mg/dL (ref 6–23)
CHLORIDE: 104 meq/L (ref 96–112)
CO2: 30 mEq/L (ref 19–32)
CREATININE: 0.9 mg/dL (ref 0.40–1.50)
Calcium: 9.6 mg/dL (ref 8.4–10.5)
GFR: 115.02 mL/min (ref >60.00)
Glucose, Bld: 104 mg/dL — ABNORMAL HIGH (ref 70–99)
Potassium: 4.4 mEq/L (ref 3.5–5.1)
SODIUM: 139 meq/L (ref 135–145)
TOTAL PROTEIN: 7.8 g/dL (ref 6.0–8.3)
Total Bilirubin: 0.5 mg/dL (ref 0.2–1.2)

## 2016-12-07 LAB — LIPID PANEL
CHOL/HDL RATIO: 4
Cholesterol: 174 mg/dL (ref 0–200)
HDL: 49.6 mg/dL (ref >39.00)
LDL CALC: 117 mg/dL — AB (ref 0–99)
NONHDL: 124.75
Triglycerides: 37 mg/dL (ref 0.0–149.0)
VLDL: 7.4 mg/dL (ref 0.0–40.0)

## 2016-12-07 MED ORDER — POLYETHYLENE GLYCOL 3350 17 G PO PACK: 17.0000 g | PACK | Freq: Every day | ORAL | 5 refills | Status: DC | PRN

## 2016-12-07 NOTE — Progress Notes (Signed)
Pre visit review using our clinic review tool, if applicable. No additional management support is needed unless otherwise documented below in the visit note. 

## 2016-12-07 NOTE — Progress Notes (Signed)
Subjective:    Patient ID: Gregory StainsDavid E Halabi, male    DOB: 08/10/1965, 51 y.o.   MRN: [id number]  DOS:  12/07/2016 Type of visit - description : f/u Interval history: No major concerns, doing well Chronic constipation on and off, request a prescription.  MiraLAX works for him Low testosterone: Good compliance with supplements. High cholesterol: Good compliance with medications, due for labs History of genital herpes: On Zovirax topical and by mouth. Vitamin D deficiency: On OTC supplements.   Review of Systems Denies chest pain or difficulty breathing No dysuria, gross hematuria difficulty urinating  Past Medical History:  Diagnosis Date  . Anxiety   . BELLS PALSY 05/25/2007   Qualifier: History of  By: Kriste BasqueNadel MD, Lonzo CloudScott M   . Constipation   . Headache(784.0)   . Hemorrhoids   . History of Bell's palsy   . History of iron deficiency   . HSV (herpes simplex virus) infection   . Hypercholesteremia   . Obesity   . OSA (obstructive sleep apnea)   . Restless leg syndrome   . Testosterone deficiency   . Vitamin D deficiency     Past Surgical History:  Procedure Laterality Date  . right inguinal hernia repair as a child      Social History   Socioeconomic History  . Marital status: Married    Spouse name: Not on file  . Number of children: 0  . Years of education: Not on file  . Highest education level: Not on file  Social Needs  . Financial resource strain: Not on file  . Food insecurity - worry: Not on file  . Food insecurity - inability: Not on file  . Transportation needs - medical: Not on file  . Transportation needs - non-medical: Not on file  Occupational History  . Occupation: [employer] , moving   Tobacco Use  . Smoking status: Never Smoker  . Smokeless tobacco: Never Used  Substance and Sexual Activity  . Alcohol use: Yes    Alcohol/week: 0.0 oz    Comment: rare wine  . Drug use: No  . Sexual activity: Not on file  Other Topics Concern  . Not on file    Social History Narrative   Lives w/ wife      Allergies as of 12/07/2016      Reactions   Codeine Phosphate    REACTION: nausea and vomiting      Medication List        Accurate as of 12/07/16  9:44 AM. Always use your most recent med list.          acyclovir 200 MG capsule Commonly known as:  ZOVIRAX Take 1 capsule (200 mg total) by mouth 2 (two) times daily.   acyclovir ointment 5 % Commonly known as:  ZOVIRAX Apply topically every 3 (three) hours as needed.   clotrimazole-betamethasone cream Commonly known as:  LOTRISONE Apply topically daily as needed.   hydrocortisone 2.5 % rectal cream Commonly known as:  PROCTOSOL HC Place rectally 2 (two) times daily as needed for hemorrhoids or itching.   NEOMYCIN-POLYMYXIN-HYDROCORTISONE 1 % Soln OTIC solution Commonly known as:  CORTISPORIN Place 2 drops into both ears 3 (three) times daily.   simvastatin 40 MG tablet Commonly known as:  ZOCOR Take 0.5 tablets (20 mg total) by mouth at bedtime.   testosterone 4 MG/24HR Pt24 patch Commonly known as:  ANDRODERM Place 1 patch onto the skin daily.   Testosterone 2 MG/24HR Pt24 Commonly known as:  ANDRODERM Place 1 patch onto the skin daily.          Objective:   Physical Exam BP 128/78 (BP Location: Left Arm, Patient Position: Sitting, Cuff Size: Normal)   Pulse 79   Temp 97.7 F (36.5 C) (Oral)   Resp 14   Ht 5\' 11"  (1.803 m)   Wt 231 lb (104.8 kg)   SpO2 98%   BMI 32.22 kg/m  General:   Well developed, well nourished . NAD.  HEENT:  Normocephalic . Face symmetric, atraumatic Neck : No thyromegaly Lungs:  CTA B Normal respiratory effort, no intercostal retractions, no accessory muscle use. Heart: RRR,  no murmur.  No pretibial edema bilaterally  Skin: Not pale. Not jaundice Neurologic:  alert & oriented X3.  Speech normal, gait appropriate for age and unassisted Psych--  Cognition and judgment appear intact.  Cooperative with normal  attention span and concentration.  Behavior appropriate. No anxious or depressed appearing.      Assessment & Plan:   Assessment  Hyperlipidemia Anxiety OSA, RLS----sleep study 2006 w/ RDI=17 & mod snoring w/ signif leg jerks; never tried CPAP, uses  Requip  "prn"; states snoring is gone w/ wt reduction  Obesity Low testosterone (f/u pcp) -->   known hx atrophic testes likely from mumps orchitis, on HRT --> sx , improved subjectively H/o Recurrent cough H/o hemorrhoids  H/o Vitamin D deficiency 2010 H/o Bell's palsy  Genital HSV -- acyclovir qd , acyclovir cream Nail dystrophy_ penlac ,creams prn +FH CAD Father   Plan: Dyslipidemia: Continue simvastatin, check a CMP and FLP Low testosterone: Continue HRT, 1 patch daily, check free and total testosterone and CBC. Genital herpes: On daily acyclovir po and also cream as needed Constipation: On and off problems, MiraLAX taken as needed works well for him.  Refill sent. Vitamin D deficiency: On 2000 units daily, last level satisfactory. RTC 4 months, CPX

## 2016-12-07 NOTE — Assessment & Plan Note (Signed)
Dyslipidemia: Continue simvastatin, check a CMP and FLP Low testosterone: Continue HRT, 1 patch daily, check free and total testosterone and CBC. Genital herpes: On daily acyclovir po and also cream as needed Constipation: On and off problems, MiraLAX taken as needed works well for him.  Refill sent. Vitamin D deficiency: On 2000 units daily, last level satisfactory. RTC 4 months, CPX

## 2016-12-07 NOTE — Patient Instructions (Signed)
GO TO THE LAB : Get the blood work     GO TO THE FRONT DESK Schedule your next appointment for a physical exam in 4 months 

## 2016-12-11 LAB — TESTOS,TOTAL,FREE AND SHBG (FEMALE)
Free Testosterone: 20.2 pg/mL — ABNORMAL LOW (ref 35.0–155.0)
Sex Hormone Binding: 46 nmol/L (ref 10–50)
Testosterone, Total, LC-MS-MS: 249 ng/dL — ABNORMAL LOW (ref 250–1100)

## 2016-12-15 ENCOUNTER — Telehealth: Payer: Self-pay

## 2016-12-15 MED ORDER — TESTOSTERONE 2 MG/24HR TD PT24: 1.0000 | MEDICATED_PATCH | Freq: Every day | TRANSDERMAL | 0 refills | Status: DC

## 2016-12-15 MED ORDER — TESTOSTERONE 4 MG/24HR TD PT24: 1.0000 | MEDICATED_PATCH | Freq: Every day | TRANSDERMAL | 0 refills | Status: DC

## 2016-12-15 NOTE — Telephone Encounter (Signed)
Request Reference Number: [id number]. ANDRODERM DIS 4MG /24HR is approved through 12/15/2017. For further questions, call [phone].

## 2016-12-15 NOTE — Telephone Encounter (Signed)
This medication was previously approved on [id number] from 12/15/2016 to 12/15/2017. You will be able to fill a prescription for this medication at your pharmacy. If your pharmacy has questions regarding the processing of your prescription, please have them call the [facility] pharmacy help desk at [phone].

## 2016-12-15 NOTE — Addendum Note (Signed)
Addended by[place]rad [place]: Clemencia Helzer D on: 12/15/2016 03:17 PM   Modules accepted: Orders

## 2016-12-15 NOTE — Telephone Encounter (Signed)
Copied from CRM [id number]. Topic: General - Other >> Dec 15, 2016  3:56 PM Conrad Burlingtonanter, Kyasia Steuck, CMA wrote: Reason for CRM: PA for Androderm 4mg /24hr patches initiated via Covermymeds; KEY: [id number]. Awaiting determination. (Pt on 6mg  total daily).

## 2016-12-15 NOTE — Telephone Encounter (Signed)
Copied from CRM [id number]. Topic: General - Other >> Dec 15, 2016  4:01 PM Conrad Burlingtonanter, Saryah Loper, CMA wrote: Reason for CRM: PA initiated via Covermymeds; KEY: [id number] for Androderm 2mg /24hr patches (Pt on 6mg  daily). Awaiting determination.

## 2017-03-15 ENCOUNTER — Ambulatory Visit (INDEPENDENT_AMBULATORY_CARE_PROVIDER_SITE_OTHER): Payer: Commercial Managed Care - PPO | Admitting: Internal Medicine

## 2017-03-15 ENCOUNTER — Encounter: Payer: Self-pay | Admitting: Internal Medicine

## 2017-03-15 VITALS — BP 124/80 | HR 68 | Temp 98.1°F | Resp 14 | Ht 71.0 in | Wt 232.5 lb

## 2017-03-15 DIAGNOSIS — E291 Testicular hypofunction: Secondary | ICD-10-CM

## 2017-03-15 DIAGNOSIS — Z0000 Encounter for general adult medical examination without abnormal findings: Secondary | ICD-10-CM

## 2017-03-15 LAB — PSA: PSA: 0.55 ng/mL (ref 0.10–4.00)

## 2017-03-15 LAB — LIPID PANEL
CHOL/HDL RATIO: 4
Cholesterol: 171 mg/dL (ref 0–200)
HDL: 40 mg/dL (ref >39.00)
LDL Cholesterol: 119 mg/dL — ABNORMAL HIGH (ref 0–99)
NONHDL: 130.93
TRIGLYCERIDES: 59 mg/dL (ref 0.0–149.0)
VLDL: 11.8 mg/dL (ref 0.0–40.0)

## 2017-03-15 LAB — COMPREHENSIVE METABOLIC PANEL
ALT: 9 U/L (ref 0–53)
AST: 14 U/L (ref 0–37)
Albumin: 4.3 g/dL (ref 3.5–5.2)
Alkaline Phosphatase: 58 U/L (ref 39–117)
BUN: 11 mg/dL (ref 6–23)
CO2: 32 meq/L (ref 19–32)
Calcium: 9.7 mg/dL (ref 8.4–10.5)
Chloride: 103 mEq/L (ref 96–112)
Creatinine, Ser: 0.87 mg/dL (ref 0.40–1.50)
GFR: 119.47 mL/min (ref >60.00)
GLUCOSE: 101 mg/dL — AB (ref 70–99)
POTASSIUM: 4.4 meq/L (ref 3.5–5.1)
Sodium: 137 mEq/L (ref 135–145)
Total Bilirubin: 0.8 mg/dL (ref 0.2–1.2)
Total Protein: 7.2 g/dL (ref 6.0–8.3)

## 2017-03-15 LAB — HEMOGLOBIN A1C: Hgb A1c MFr Bld: 5.9 % (ref 4.6–6.5)

## 2017-03-15 NOTE — Progress Notes (Signed)
Pre visit review using our clinic review tool, if applicable. No additional management support is needed unless otherwise documented below in the visit note. 

## 2017-03-15 NOTE — Assessment & Plan Note (Addendum)
High cholesterol: On simvastatin, checking labs Low testosterone: last testosterone was low, in addition to the 4 mg patch, added a  2 mg patch of "T".  Good compliance and tolerance.  Check a free and total testosterone, PSA , last CBC ok  Vitamin D deficiency, on supplements, last level satisfactory Lipoma, right arm: Unchanged from previous exams, observation, call if it changes Other problem seems a stable. RTC 6 months

## 2017-03-15 NOTE — Patient Instructions (Signed)
GO TO THE LAB : Get the blood work     GO TO THE FRONT DESK Schedule your next appointment for a   checkup in 6 months 

## 2017-03-15 NOTE — Assessment & Plan Note (Signed)
-  Td 2015; had Flu shot   -CCS: cscope 2010 , Dr Christella HartiganJacobs, 1 polyp, 10 years -Labs : CMP, FLP, A1c, PSA, total and free T -Diet and exercise: Discussed

## 2017-03-15 NOTE — Progress Notes (Signed)
Subjective:    Patient ID: Gregory Santos, male    DOB: 22-Jan-1966, 52 y.o.   MRN: [id number]  DOS:  03/15/2017 Type of visit - description : cpx Interval history: No major concerns, his testosterone was low, HRT dose increased, good compliance, objectively feels "a little better" although he could not elaborate.   Wt Readings from Last 3 Encounters:  03/15/17 232 lb 8 oz (105.5 kg)  12/07/16 231 lb (104.8 kg)  12/03/15 240 lb 8 oz (109.1 kg)     Review of Systems  A 14 point review of systems is negative   Past Medical History:  Diagnosis Date  . Anxiety   . BELLS PALSY 05/25/2007   Qualifier: History of  By: Kriste Basque MD, Lonzo Cloud   . Constipation   . Headache(784.0)   . Hemorrhoids   . History of Bell's palsy   . History of iron deficiency   . HSV (herpes simplex virus) infection   . Hypercholesteremia   . Obesity   . OSA (obstructive sleep apnea)   . Restless leg syndrome   . Testosterone deficiency   . Vitamin D deficiency     Past Surgical History:  Procedure Laterality Date  . right inguinal hernia repair as a child      Social History   Socioeconomic History  . Marital status: Married    Spouse name: Not on file  . Number of children: 0  . Years of education: Not on file  . Highest education level: Not on file  Social Needs  . Financial resource strain: Not on file  . Food insecurity - worry: Not on file  . Food insecurity - inability: Not on file  . Transportation needs - medical: Not on file  . Transportation needs - non-medical: Not on file  Occupational History  . 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 Smoking status: Never Smoker  . Smokeless tobacco: Never Used  Substance and Sexual Activity  . Alcohol use: Yes    Alcohol/week: 0.0 oz    Comment: rare wine  . Drug use: No  . Sexual activity: Not on file  Other Topics Concern  . Not on file  Social History Narrative   Lives w/ wife     Family History  Problem  Relation Age of Onset  . CAD Father        at a young age  . Diabetes Mother        Judie Petit , [place]  . Stroke Other        GF (late onset)  . Colon cancer Neg Hx   . Prostate cancer Neg Hx      Allergies as of 03/15/2017      Reactions   Codeine Phosphate    REACTION: nausea and vomiting      Medication List        Accurate as of 03/15/17  6:38 PM. Always use your most recent med list.          acyclovir 200 MG capsule Commonly known as:  ZOVIRAX Take 1 capsule (200 mg total) by mouth 2 (two) times daily.   acyclovir ointment 5 % Commonly known as:  ZOVIRAX Apply topically every 3 (three) hours as needed.   cholecalciferol 1000 units tablet Commonly known as:  VITAMIN D Take 2,000 Units daily by mouth.   clotrimazole-betamethasone cream Commonly known as:  LOTRISONE Apply topically daily as needed.   hydrocortisone 2.5 % rectal cream  Commonly known as:  PROCTOSOL HC Place rectally 2 (two) times daily as needed for hemorrhoids or itching.   polyethylene glycol packet Commonly known as:  MIRALAX / GLYCOLAX Take 17 g daily as needed by mouth.   simvastatin 40 MG tablet Commonly known as:  ZOCOR Take 0.5 tablets (20 mg total) by mouth at bedtime.   Testosterone 2 MG/24HR Pt24 Commonly known as:  ANDRODERM Place 1 patch onto the skin daily.   testosterone 4 MG/24HR Pt24 patch Commonly known as:  ANDRODERM Place 1 patch onto the skin daily.          Objective:   Physical Exam BP 124/80 (BP Location: Right Arm, Patient Position: Sitting, Cuff Size: Normal)   Pulse 68   Temp 98.1 F (36.7 C) (Oral)   Resp 14   Ht 5\' 11"  (1.803 m)   Wt 232 lb 8 oz (105.5 kg)   SpO2 98%   BMI 32.43 kg/m  General:   Well developed, well nourished . NAD.  Neck: No  thyromegaly  HEENT:  Normocephalic . Face symmetric, atraumatic Lungs:  CTA B Normal respiratory effort, no intercostal retractions, no accessory muscle use. Heart: RRR,  no murmur.  No pretibial edema  bilaterally  Abdomen:  Not distended, soft, non-tender. No rebound or rigidity.   Skin: Has a 1 cm, mobile, not tender SQ mass similar to the physical exam in 2017. Neurologic:  alert & oriented X3.  Speech normal, gait appropriate for age and unassisted Strength symmetric and appropriate for age.  Psych: Cognition and judgment appear intact.  Cooperative with normal attention span and concentration.  Behavior appropriate. No anxious or depressed appearing.     Assessment & Plan:   Assessment  Hyperlipidemia Anxiety OSA, RLS----sleep study 2006 w/ RDI=17 & mod snoring w/ signif leg jerks; never tried CPAP, uses  Requip  "prn"; states snoring is gone w/ wt reduction  Obesity Low testosterone (f/u pcp) -->   known hx atrophic testes likely from mumps orchitis, on HRT --> sx , improved subjectively H/o Recurrent cough H/o hemorrhoids  H/o Vitamin D deficiency 2010 H/o Bell's palsy Genital HSV -- acyclovir qd , acyclovir cream Nail dystrophy_ penlac ,creams prn +FH CAD Father   Plan: High cholesterol: On simvastatin, checking labs Low testosterone: last testosterone was low, in addition to the 4 mg patch, added a  2 mg patch of "T".  Good compliance and tolerance.  Check a free and total testosterone, PSA , last CBC ok Vitamin D deficiency, on supplements, last level satisfactory Lipoma, right arm: Unchanged from previous exams, observation, call if it changes Other problem seems a stable. RTC 6 months

## 2017-03-16 LAB — TESTOSTERONE TOTAL,FREE,BIO, MALES
ALBUMIN MSPROF: 4.4 g/dL (ref 3.6–5.1)
SEX HORMONE BINDING: 39 nmol/L (ref 10–50)
TESTOSTERONE BIOAVAILABLE: 146 ng/dL (ref >=110.0)
TESTOSTERONE FREE: 72.5 pg/mL (ref 46.0–224.0)
Testosterone: 598 ng/dL (ref 250–827)

## 2017-04-01 ENCOUNTER — Telehealth: Payer: Self-pay | Admitting: Internal Medicine

## 2017-04-01 ENCOUNTER — Other Ambulatory Visit: Payer: Self-pay | Admitting: Internal Medicine

## 2017-04-01 NOTE — Telephone Encounter (Signed)
Copied from CRM [phone]. Topic: Quick Communication - Rx Refill/Question >> Apr 01, 2017  1:11 PM [place], Louisianahiquita C wrote:    Medication: acyclovir (ZOVIRAX) 200 MG capsule , acyclovir ointment (ZOVIRAX) 5 % , simvastatin (ZOCOR) 40 MG tablet , Testosterone (ANDRODERM) 2 MG/24HR PT24 ,  testosterone (ANDRODERM) 4 MG/24HR PT24 patch    Has the patient contacted their pharmacy?yes - they were told to request via PCP    (Agent: If no, request that the patient contact the pharmacy for the refill.)   Preferred Pharmacy (with phone number or street name):[facility] [place] [phone]   Agent: Please be advised that RX refills may take up to 3 business days. We ask that you follow-up with your pharmacy.

## 2017-04-05 NOTE — Addendum Note (Signed)
Addended byConrad [place]: Valentino Saavedra D on: 04/05/2017 03:35 PM   Modules accepted: Orders

## 2017-04-05 NOTE — Telephone Encounter (Signed)
Didn't know Pt was out of medication- sending request to PCP.

## 2017-04-05 NOTE — Telephone Encounter (Signed)
Patient wife calling back wanting to know how long is it going to take for the two prescriptions stating that her husband is out of the patches   Testosterone (ANDRODERM) 2 MG/24HR PT24  testosterone (ANDRODERM) 4 MG/24HR PT24 patch

## 2017-04-06 MED ORDER — TESTOSTERONE 4 MG/24HR TD PT24: 1.0000 | MEDICATED_PATCH | Freq: Every day | TRANSDERMAL | 1 refills | Status: DC

## 2017-04-06 MED ORDER — TESTOSTERONE 2 MG/24HR TD PT24: 1.0000 | MEDICATED_PATCH | Freq: Every day | TRANSDERMAL | 1 refills | Status: DC

## 2017-04-06 NOTE — Telephone Encounter (Signed)
rx sent

## 2017-04-06 NOTE — Addendum Note (Signed)
Addended by: Willow OraPAZ, JOSE E on: 04/06/2017 10:00 AM   Modules accepted: Orders

## 2017-07-18 ENCOUNTER — Other Ambulatory Visit: Payer: Self-pay | Admitting: Internal Medicine

## 2017-07-18 NOTE — Telephone Encounter (Signed)
Pt is requesting refill on Androderm 2mg  and 4mg  (Pt on total of 6mg ), Proctosol HC, Lotrisone cream.   Last OV: 03/15/2017 Last Fill on Androderm: 04/06/2017 #90 patch and 1RF (Request is for on file) Last Fill on Proctosol HC: 12/03/2015 #28.35g and 2RF Last Fill on Lotrisone cream: 12/03/2015 #135g and 3RF  Please advise.

## 2017-07-18 NOTE — Telephone Encounter (Signed)
All RX  Sent

## 2017-09-13 ENCOUNTER — Ambulatory Visit: Payer: Commercial Managed Care - PPO | Admitting: Internal Medicine

## 2017-09-20 ENCOUNTER — Encounter: Payer: Self-pay | Admitting: Internal Medicine

## 2017-09-20 ENCOUNTER — Other Ambulatory Visit: Payer: Self-pay | Admitting: Internal Medicine

## 2017-09-20 ENCOUNTER — Ambulatory Visit (INDEPENDENT_AMBULATORY_CARE_PROVIDER_SITE_OTHER): Payer: Commercial Managed Care - PPO | Admitting: Internal Medicine

## 2017-09-20 VITALS — BP 144/94 | HR 69 | Temp 98.2°F | Resp 18 | Ht 71.0 in | Wt 234.0 lb

## 2017-09-20 DIAGNOSIS — R739 Hyperglycemia, unspecified: Secondary | ICD-10-CM | POA: Diagnosis not present

## 2017-09-20 DIAGNOSIS — E119 Type 2 diabetes mellitus without complications: Secondary | ICD-10-CM | POA: Insufficient documentation

## 2017-09-20 DIAGNOSIS — E291 Testicular hypofunction: Secondary | ICD-10-CM | POA: Diagnosis not present

## 2017-09-20 MED ORDER — SILDENAFIL CITRATE 20 MG PO TABS: 60.0000 mg | ORAL_TABLET | Freq: Every evening | ORAL | 3 refills | Status: DC | PRN

## 2017-09-20 NOTE — Patient Instructions (Signed)
GO TO THE LAB : Get the blood work     GO TO THE FRONT DESK Schedule your next appointment for a physical exam, fasting by 02-2018   Check the  blood pressure  monthly   Be sure your blood pressure is between 110/65 and  135/85. If it is consistently higher or lower, let me know

## 2017-09-20 NOTE — Assessment & Plan Note (Signed)
Hyperglycemia: Patient made aware of dx, recommend good diet physical activity Hypogonadism: On 2 patches of testosterone daily, last level satisfactory, recheck today ED: New issue, no contraindications for Viagra, how to use it discussed with the patient, watch for side effects. Wonders about the shingles vaccination, it is okay, personally I recommend to postpone it for few years. Mild elevated BP today: No history of HTN, recommend to monitor BPs, see AVS RTC CPX 2- 2020

## 2017-09-20 NOTE — Progress Notes (Signed)
Subjective:    Patient ID: Gregory Santos, male    DOB: [birth year], 52 y.o.   MRN: [id number]  DOS:  09/20/2017 Type of visit - description : f/u Interval history: Good compliance to medication. Admits his diet has not been the best lately BP today slightly elevated, no ambulatory BPs. Reports occasional difficulty with erections:  ?  Viagra  Wt Readings from Last 3 Encounters:  09/20/17 234 lb (106.1 kg)  03/15/17 232 lb 8 oz (105.5 kg)  12/07/16 231 lb (104.8 kg)     Review of Systems Denies chest pain no difficulty breathing No dysuria or gross hematuria No nausea or vomiting Past Medical History:  Diagnosis Date  . Anxiety   . BELLS PALSY 05/25/2007   Qualifier: History of  By: Kriste Basque MD, Lonzo Cloud   . Constipation   . Headache(784.0)   . Hemorrhoids   . History of Bell's palsy   . History of iron deficiency   . HSV (herpes simplex virus) infection   . Hypercholesteremia   . Obesity   . OSA (obstructive sleep apnea)   . Restless leg syndrome   . Testosterone deficiency   . Vitamin D deficiency     Past Surgical History:  Procedure Laterality Date  . right inguinal hernia repair as a child      Social History   Socioeconomic History  . Marital status: Married    Spouse name: Not on file  . Number of children: 0  . Years of education: Not on file  . Highest education level: Not on file  Occupational History  . Occupation: city transfer , record mngmt   Social Needs  . Financial resource strain: Not on file  . Food insecurity:    Worry: Not on file    Inability: Not on file  . Transportation needs:    Medical: Not on file    Non-medical: Not on file  Tobacco Use  . Smoking status: Never Smoker  . Smokeless tobacco: Never Used  Substance and Sexual Activity  . Alcohol use: Yes    Alcohol/week: 0.0 standard drinks    Comment: rare wine  . Drug use: No  . Sexual activity: Not on file  Lifestyle  . Physical activity:    Days per week: Not on file      Minutes per session: Not on file  . Stress: Not on file  Relationships  . Social connections:    Talks on phone: Not on file    Gets together: Not on file    Attends religious service: Not on file    Active member of club or organization: Not on file    Attends meetings of clubs or organizations: Not on file    Relationship status: Not on file  . Intimate partner violence:    Fear of current or ex partner: Not on file    Emotionally abused: Not on file    Physically abused: Not on file    Forced sexual activity: Not on file  Other Topics Concern  . Not on file  Social History Narrative   Lives w/ wife      Allergies as of 09/20/2017      Reactions   Codeine Phosphate    REACTION: nausea and vomiting      Medication List        Accurate as of 09/20/17  6:31 PM. Always use your most recent med list.          acyclovir  200 MG capsule Commonly known as:  ZOVIRAX Take 1 capsule (200 mg total) by mouth 2 (two) times daily.   acyclovir ointment 5 % Commonly known as:  ZOVIRAX APPLY TOPICALLY EVERY 3  HOURS AS DIRECTED   ANDRODERM 2 MG/24HR Pt24 Generic drug:  Testosterone APPLY 1 PATCH ONTO THE SKIN DAILY   ANDRODERM 4 MG/24HR Pt24 patch Generic drug:  testosterone APPLY 1 PATCH ONTO THE SKIN DAILY WITH 2MG  PATCH FOR  TOTAL DOSE OF 6MG  DAILY   cholecalciferol 1000 units tablet Commonly known as:  VITAMIN D Take 2,000 Units daily by mouth.   clotrimazole-betamethasone cream Commonly known as:  LOTRISONE APPLY TOPICALLY DAILY AS  NEEDED   polyethylene glycol packet Commonly known as:  MIRALAX / GLYCOLAX Take 17 g daily as needed by mouth.   PROCTOSOL HC 2.5 % rectal cream Generic drug:  hydrocortisone APPLY RECTALLY 2 (TWO)  TIMES DAILY AS NEEDED FOR  HEMORRHOIDS OR ITCHING.   sildenafil 20 MG tablet Commonly known as:  REVATIO Take 3-4 tablets (60-80 mg total) by mouth at bedtime as needed.   simvastatin 40 MG tablet Commonly known as:  ZOCOR Take  0.5 tablets (20 mg total) by mouth at bedtime.          Objective:   Physical Exam BP (!) 144/94 (BP Location: Right Arm, Cuff Size: Large)   Pulse 69   Temp 98.2 F (36.8 C) (Oral)   Resp 18   Ht 5\' 11"  (1.803 m)   Wt 234 lb (106.1 kg)   SpO2 98%   BMI 32.64 kg/m     BP 144/94, I recheck: 140/85. General:   Well developed, NAD, see BMI.  HEENT:  Normocephalic . Face symmetric, atraumatic Lungs:  CTA B Normal respiratory effort, no intercostal retractions, no accessory muscle use. Heart: RRR,  no murmur.  No pretibial edema bilaterally  Skin: Not pale. Not jaundice Neurologic:  alert & oriented X3.  Speech normal, gait appropriate for age and unassisted Psych--  Cognition and judgment appear intact.  Cooperative with normal attention span and concentration.  Behavior appropriate. No anxious or depressed appearing.    Assessment & Plan:   Assessment  Hyperglycemia, A1c 5.9  hyperlipidemia Anxiety OSA, RLS----sleep study 2006 w/ RDI=17 & mod snoring w/ signif leg jerks; never tried CPAP, uses  Requip  "prn"; states snoring is gone w/ wt reduction  Obesity Low testosterone (f/u pcp) -->   known hx atrophic testes likely from mumps orchitis, on HRT --> sx , improved subjectively H/o Recurrent cough H/o hemorrhoids  H/o Vitamin D deficiency 2010 H/o Bell's palsy Genital HSV -- acyclovir qd , acyclovir cream Nail dystrophy_ penlac ,creams prn +FH CAD Father   Plan: Hyperglycemia: Patient made aware of dx, recommend good diet physical activity Hypogonadism: On 2 patches of testosterone daily, last level satisfactory, recheck today ED: New issue, no contraindications for Viagra, how to use it discussed with the patient, watch for side effects. Wonders about the shingles vaccination, it is okay, personally I recommend to postpone it for few years. Mild elevated BP today: No history of HTN, recommend to monitor BPs, see AVS RTC CPX 2- 2020

## 2017-09-21 LAB — TESTOSTERONE,FREE AND TOTAL
TESTOSTERONE FREE: 7.1 pg/mL — AB (ref 7.2–24.0)
TESTOSTERONE: 474 ng/dL (ref 264–916)

## 2017-09-22 ENCOUNTER — Other Ambulatory Visit: Payer: Self-pay | Admitting: Internal Medicine

## 2017-12-13 ENCOUNTER — Other Ambulatory Visit: Payer: Self-pay | Admitting: Internal Medicine

## 2017-12-14 ENCOUNTER — Other Ambulatory Visit: Payer: Self-pay | Admitting: Internal Medicine

## 2017-12-14 ENCOUNTER — Telehealth: Payer: Self-pay

## 2017-12-14 ENCOUNTER — Ambulatory Visit: Payer: Self-pay

## 2017-12-14 MED ORDER — HYDROCORTISONE 2.5 % RE CREA: TOPICAL_CREAM | RECTAL | 1 refills | Status: DC

## 2017-12-14 MED ORDER — ACYCLOVIR 200 MG PO CAPS: 200.0000 mg | ORAL_CAPSULE | Freq: Two times a day (BID) | ORAL | 3 refills | Status: DC

## 2017-12-14 MED ORDER — ACYCLOVIR 5 % EX OINT: TOPICAL_OINTMENT | CUTANEOUS | 3 refills | Status: DC

## 2017-12-14 MED ORDER — CLOTRIMAZOLE-BETAMETHASONE 1-0.05 % EX CREA: TOPICAL_CREAM | Freq: Every day | CUTANEOUS | 3 refills | Status: DC | PRN

## 2017-12-14 MED ORDER — SIMVASTATIN 40 MG PO TABS: 20.0000 mg | ORAL_TABLET | Freq: Every day | ORAL | 3 refills | Status: DC

## 2017-12-14 MED ORDER — TESTOSTERONE 4 MG/24HR TD PT24: MEDICATED_PATCH | TRANSDERMAL | 1 refills | Status: DC

## 2017-12-14 NOTE — Telephone Encounter (Signed)
Requested medication (s) are due for refill today: yes  Requested medication (s) are on the active medication list: yes  Future visit scheduled: yes  Notes to clinic:  Unable to fill per protocol    Requested Prescriptions  Pending Prescriptions Disp Refills   Testosterone (ANDRODERM) 2 MG/24HR PT24 90 patch 1     Off-Protocol Failed - 12/14/2017 10:16 AM      Failed - Medication not assigned to a protocol, review manually.      Passed - Valid encounter within last 12 months    Recent Outpatient Visits          2 months ago Hypogonadism in male   Holiday representative at [facility], Nolon Rod, MD   9 months ago Annual physical exam   [employer] at [facility] [facility], Nolon Rod, MD   1 year ago Research scientist (medical) at [facility], Nolon Rod, MD   2 years ago Annual physical exam   Holiday representative at [facility], [place] E, MD   2 years ago Hypogonadism in male   Holiday representative at [facility] [facility], Nolon Rod, MD      Future Appointments            In 3 months Wanda Plump, MD [employer] at [facility], [facility]          testosterone (ANDRODERM) 4 MG/24HR PT24 patch 90 patch 1     Off-Protocol Failed - 12/14/2017 10:16 AM      Failed - Medication not assigned to a protocol, review manually.      Passed - Valid encounter within last 12 months    Recent Outpatient Visits          2 months ago Hypogonadism in male   Holiday representative at [facility], Nolon Rod, MD   9 months ago Annual physical exam   Holiday representative at [facility] [facility], [place] E, MD   1 year ago Research scientist (medical) at [facility], Nolon Rod, MD   2 years ago Annual physical exam   Holiday representative at [facility] [place], [place] E, MD   2 years ago  Hypogonadism in male   Holiday representative at [employer], Nolon Rod, MD      Future Appointments            In 3 months Wanda Plump, MD [employer] at [facility], [facility]          clotrimazole-betamethasone (LOTRISONE) cream 135 g 3    Sig: Apply topically daily as needed.     Off-Protocol Failed - 12/14/2017 10:16 AM      Failed - Medication not assigned to a protocol, review manually.      Passed - Valid encounter within last 12 months    Recent Outpatient Visits          2 months ago Hypogonadism in male   Holiday representative at [facility] [facility], Nolon Rod, MD   9 months ago Annual physical exam   [employer] at [facility] [facility], [place] E, MD   1 year ago Research scientist (medical) at [employer], Nolon Rod, [place]  2 years ago Annual physical exam   [facility] [place], [place], [place]   2 years ago Hypogonadism in male   [facility], Nolon RodJose E, MD      Future Appointments            In 3 months Wanda PlumpPaz, [place] E, MD [facility] [place]          hydrocortisone (PROCTOSOL [facility]) 2.5 % rectal cream 85.05 g 1     Off-Protocol Failed - 12/14/2017 10:16 AM      Failed - Medication not assigned to a protocol, review manually.      Passed - Valid encounter within last 12 months    Recent Outpatient Visits          2 months ago Hypogonadism in male   [facility], Nolon RodJose E, MD   9 months ago Annual physical exam   [facility] [place], [place] E, [place]   1 year ago Research scientist (medical)HYPERCHOLESTEROLEMIA   [facility] [place], [place] E, [place]   2 years ago Annual physical exam   [facility] [place], [place] E, MD   2 years  ago Hypogonadism in male   [facility] [place], Nolon RodJose E, MD      Future Appointments            In 3 months Drue NovelPaz, Nolon RodJose E, MD [facility] [place], [place]

## 2017-12-14 NOTE — Telephone Encounter (Signed)
Pt is requesting refill on Androderm 2mg  and 4mg .   Last OV: 09/20/2017 Last Fill on both: 07/18/2017 #90 patch and 1RF Last Lab: 09/20/2017  NCCR printed- no discrepancies noted- sent for scanning.

## 2017-12-14 NOTE — Telephone Encounter (Signed)
Androderm has been sent to PCP for review and refill. Other Rx's sent.

## 2017-12-14 NOTE — Telephone Encounter (Signed)
Copied from CRM [id number]. Topic: General - Other >> Dec 14, 2017  8:41 AM Leafy Roobinson, Norma J wrote: Reason for CRM: pt wife is calling and her husband needs refills on androderm 2 mg and androderm 4 mg patches, simvastatin, acyclovir 200 mg and acyclovir ointment 5 %. [facility]. Please send to local pharm pt is out of meds. Pt needs 30 day supply

## 2017-12-14 NOTE — Telephone Encounter (Signed)
Pt called to refill medications:   androderm 2 mg/24 hr pt24 androderm 4 mg/24 hr pt24 Clotrimazole-betamethasone cream proctosol hc 2.5%

## 2017-12-14 NOTE — Telephone Encounter (Signed)
Called and spoke to pt regarding his request for a Z-Pack. I informed the pt that he would need to be seen before and antibiotic could be filled. Pt stated that it was not a good time to make an appt and he would try an OTC med.

## 2017-12-14 NOTE — Telephone Encounter (Signed)
Copied from CRM [id number]. Topic: General - Other >> Dec 14, 2017 11:09 AM Gaynelle AduPoole, Shalonda wrote: Reason for CRM:  patient is calling to request a Z Pack and something for a itchy throat he stated he is not sick and wanted to catch it early advise him we may need to come in the office. Please advise

## 2017-12-14 NOTE — Telephone Encounter (Signed)
Will need an appointment for ABX.

## 2017-12-14 NOTE — Telephone Encounter (Signed)
Sent!

## 2018-01-23 ENCOUNTER — Telehealth: Payer: Self-pay

## 2018-01-23 NOTE — Telephone Encounter (Signed)
PA approved through 01/24/2019.

## 2018-01-23 NOTE — Telephone Encounter (Signed)
PA initiated via Covermymeds; KEY: [id number]. Awaiting determination.

## 2018-01-24 ENCOUNTER — Telehealth: Payer: Self-pay | Admitting: Internal Medicine

## 2018-01-24 ENCOUNTER — Telehealth: Payer: Self-pay

## 2018-01-24 NOTE — Telephone Encounter (Signed)
Copied from CRM [id number]. Topic: Quick Communication - Rx Refill/Question >> Jan 24, 2018 11:30 AM Gregory Santos, Gregory Santos wrote: Medication: Testosterone Gregory Santos(ANDRODERM) 2 MG/24HR PT24 - Needs prior authorization and  testosterone (ANDRODERM) 4 MG/24HR PT24 patch - needs prior authorization   Has the patient contacted their pharmacy? Yes.  [facility] but [facility] stated that the 4MG  patches were approved / please clarify  Preferred Pharmacy (with phone number or street name): [id number] [name], [place] - [address] [phone] (Phone) [phone] (Fax)    Agent: Please be advised that RX refills may take up to 3 business days. We ask that you follow-up with your pharmacy.

## 2018-01-24 NOTE — Telephone Encounter (Signed)
PA initiated via Covermymeds; KEY: [id number]: [id number].   This medication or product was previously approved on [id number] from 2018-01-23 to 2019-01-24. **Please note: Formulary lowering, tiering exception, cost reduction and/or pre-benefit determination review (including prospective Medicare hospice reviews) requests cannot be requested using this method of submission. Please contact us at [phone] instead.

## 2018-01-24 NOTE — Telephone Encounter (Signed)
Prior authorizations completed yesterday.

## 2018-03-13 ENCOUNTER — Encounter: Payer: Self-pay | Admitting: Gastroenterology

## 2018-03-21 ENCOUNTER — Encounter: Payer: Commercial Managed Care - PPO | Admitting: Internal Medicine

## 2018-05-16 ENCOUNTER — Telehealth: Payer: Self-pay

## 2018-05-16 NOTE — Telephone Encounter (Signed)
Spoke w/ Pt- made him aware of virtual visits- currently working on a project at work. He will call back to schedule.

## 2018-06-17 ENCOUNTER — Other Ambulatory Visit: Payer: Self-pay | Admitting: Internal Medicine

## 2018-07-19 ENCOUNTER — Encounter: Payer: Self-pay | Admitting: Internal Medicine

## 2018-07-19 ENCOUNTER — Other Ambulatory Visit: Payer: Self-pay

## 2018-07-19 ENCOUNTER — Ambulatory Visit (INDEPENDENT_AMBULATORY_CARE_PROVIDER_SITE_OTHER): Payer: Commercial Managed Care - PPO | Admitting: Internal Medicine

## 2018-07-19 DIAGNOSIS — E7800 Pure hypercholesterolemia, unspecified: Secondary | ICD-10-CM | POA: Diagnosis not present

## 2018-07-19 DIAGNOSIS — B009 Herpesviral infection, unspecified: Secondary | ICD-10-CM

## 2018-07-19 DIAGNOSIS — E291 Testicular hypofunction: Secondary | ICD-10-CM

## 2018-07-19 MED ORDER — ACYCLOVIR 200 MG PO CAPS: 200.0000 mg | ORAL_CAPSULE | Freq: Two times a day (BID) | ORAL | 0 refills | Status: DC

## 2018-07-19 MED ORDER — ACYCLOVIR 5 % EX OINT: TOPICAL_OINTMENT | CUTANEOUS | 0 refills | Status: DC

## 2018-07-19 MED ORDER — SILDENAFIL CITRATE 20 MG PO TABS: ORAL_TABLET | ORAL | 0 refills | Status: DC

## 2018-07-19 MED ORDER — SIMVASTATIN 40 MG PO TABS: 20.0000 mg | ORAL_TABLET | Freq: Every day | ORAL | 0 refills | Status: DC

## 2018-07-19 MED ORDER — ANDRODERM 2 MG/24HR TD PT24: MEDICATED_PATCH | TRANSDERMAL | 0 refills | Status: DC

## 2018-07-19 MED ORDER — ANDRODERM 4 MG/24HR TD PT24: MEDICATED_PATCH | TRANSDERMAL | 0 refills | Status: DC

## 2018-07-19 NOTE — Progress Notes (Signed)
Subjective:    Patient ID: Gregory StainsDavid E Windholz, male    DOB: 01/15/1966, 53 y.o.   MRN: [id number]  DOS:  07/19/2018 Type of visit - description: Virtual Visit via Video Note  I connected with@ on 07/19/18 at  3:20 PM EDT by a video enabled telemedicine application and verified that I am speaking with the correct person using two identifiers.   THIS ENCOUNTER IS A VIRTUAL VISIT DUE TO COVID-19 - PATIENT WAS NOT SEEN IN THE OFFICE. PATIENT HAS CONSENTED TO VIRTUAL VISIT / TELEMEDICINE VISIT   Location of patient: home  Location of provider: office  I discussed the limitations of evaluation and management by telemedicine and the availability of in person appointments. The patient expressed understanding and agreed to proceed.  History of Present Illness: Acute Since the last visit in August 2018 he is doing well. Request multiple refills.     Review of Systems denies fever chills No chest pain no difficulty breathing No recent ambulatory BPs Current weight unchanged from previous.   Past Medical History:  Diagnosis Date  . Anxiety   . BELLS PALSY 05/25/2007   Qualifier: History of  By: Kriste BasqueNadel MD, Lonzo CloudScott M   . Constipation   . Headache(784.0)   . Hemorrhoids   . History of Bell's palsy   . History of iron deficiency   . HSV (herpes simplex virus) infection   . Hypercholesteremia   . Obesity   . OSA (obstructive sleep apnea)   . Restless leg syndrome   . Testosterone deficiency   . Vitamin D deficiency     Past Surgical History:  Procedure Laterality Date  . right inguinal hernia repair as a child      Social History   Socioeconomic History  . Marital status: Married    Spouse name: Not on file  . Number of children: 0  . Years of education: Not on file  . Highest education level: Not on file  Occupational History  . Occupation: city transfer , record mngmt   Social Needs  . Financial resource strain: Not on file  . Food insecurity    Worry: Not on file     Inability: Not on file  . Transportation needs    Medical: Not on file    Non-medical: Not on file  Tobacco Use  . Smoking status: Never Smoker  . Smokeless tobacco: Never Used  Substance and Sexual Activity  . Alcohol use: Yes    Alcohol/week: 0.0 standard drinks    Comment: rare wine  . Drug use: No  . Sexual activity: Not on file  Lifestyle  . Physical activity    Days per week: Not on file    Minutes per session: Not on file  . Stress: Not on file  Relationships  . Social Musicianconnections    Talks on phone: Not on file    Gets together: Not on file    Attends religious service: Not on file    Active member of club or organization: Not on file    Attends meetings of clubs or organizations: Not on file    Relationship status: Not on file  . Intimate partner violence    Fear of current or ex partner: Not on file    Emotionally abused: Not on file    Physically abused: Not on file    Forced sexual activity: Not on file  Other Topics Concern  . Not on file  Social History Narrative   Lives w/ wife  Allergies as of 07/19/2018      Reactions   Codeine Phosphate    REACTION: nausea and vomiting      Medication List       Accurate as of July 19, 2018  3:32 PM. If you have any questions, ask your nurse or doctor.        acyclovir 200 MG capsule Commonly known as: ZOVIRAX Take 1 capsule (200 mg total) by mouth 2 (two) times daily.   acyclovir ointment 5 % Commonly known as: ZOVIRAX APPLY TOPICALLY EVERY 3  HOURS AS DIRECTED   cholecalciferol 1000 units tablet Commonly known as: VITAMIN D Take 2,000 Units daily by mouth.   clotrimazole-betamethasone cream Commonly known as: LOTRISONE Apply topically daily as needed.   hydrocortisone 2.5 % rectal cream Commonly known as: Proctosol HC APPLY RECTALLY 2 (TWO)  TIMES DAILY AS NEEDED FOR  HEMORRHOIDS OR ITCHING.   polyethylene glycol 17 g packet Commonly known as: MIRALAX / GLYCOLAX Take 17 g daily as needed  by mouth.   sildenafil 20 MG tablet Commonly known as: REVATIO TAKE 3 TO 4 TABLETS BY MOUTH AT BEDTIME AS NEEDED   simvastatin 40 MG tablet Commonly known as: ZOCOR Take 0.5 tablets (20 mg total) by mouth at bedtime.   Testosterone 2 MG/24HR Pt24 Commonly known as: Androderm Apply 1 patch onto skin daily along with 4mg  patch for total of 6mg  daily   testosterone 4 MG/24HR Pt24 patch Commonly known as: Androderm APPLY 1 PATCH ONTO THE SKIN DAILY WITH 2MG  PATCH FOR  TOTAL DOSE OF 6MG  DAILY           Objective:   Physical Exam There were no vitals taken for this visit. This is a virtual video visit, the patient looks well, alert oriented x3, no apparent distress    Assessment     Assessment  Hyperglycemia, A1c 5.9  hyperlipidemia Anxiety OSA, RLS----sleep study 2006 w/ RDI=17 & mod snoring w/ signif leg jerks; never tried CPAP, uses  Requip  "prn"; states snoring is gone w/ wt reduction  Obesity Low testosterone (f/u pcp) -->   known hx atrophic testes likely from mumps orchitis, on HRT --> sx , improved subjectively H/o Recurrent cough H/o hemorrhoids  H/o Vitamin D deficiency 2010 H/o Bell's palsy Genital HSV -- acyclovir qd , acyclovir cream Nail dystrophy_ penlac ,creams prn +FH CAD Father   Plan: Hyperlipidemia: On simvastatin, refill medications Low testosterone: We will refill his patches for 2 months. Genital HSV: Refill acyclovir oral and cream. ED: Refill sildenafil. RTC within 2 months.    I discussed the assessment and treatment plan with the patient. The patient was provided an opportunity to ask questions and all were answered. The patient agreed with the plan and demonstrated an understanding of the instructions.   The patient was advised to call back or seek an in-person evaluation if the symptoms worsen or if the condition fails to improve as anticipated.

## 2018-07-20 NOTE — Assessment & Plan Note (Signed)
Hyperlipidemia: On simvastatin, refill medications Low testosterone: We will refill his patches for 2 months. Genital HSV: Refill acyclovir oral and cream. ED: Refill sildenafil. RTC within 2 months.

## 2018-10-20 ENCOUNTER — Other Ambulatory Visit: Payer: Self-pay | Admitting: Internal Medicine

## 2018-10-20 NOTE — Telephone Encounter (Signed)
dication: sildenafil (REVATIO) 20 MG tablet [[id number]]   Has the patient contacted their pharmacy? Yes  (Agent: If no, request that the patient contact the pharmacy for the refill.) (Agent: If yes, when and what did the pharmacy advise?)  Preferred Pharmacy (with phone number or street name): [facility] [address] [place], [place] [address] [phone]  Agent: Please be advised that RX refills may take up to 3 business days. We ask that you follow-up with your pharmacy.

## 2018-10-21 MED ORDER — SILDENAFIL CITRATE 20 MG PO TABS: ORAL_TABLET | ORAL | 0 refills | Status: DC

## 2019-01-12 ENCOUNTER — Other Ambulatory Visit: Payer: Self-pay | Admitting: Internal Medicine

## 2019-01-12 NOTE — Telephone Encounter (Signed)
Last OV 07/19/18 Last refill 07/19/18 #180/0 Next OV not scheduled

## 2019-04-09 ENCOUNTER — Other Ambulatory Visit: Payer: Self-pay | Admitting: Internal Medicine

## 2019-04-09 ENCOUNTER — Telehealth: Payer: Self-pay

## 2019-04-09 NOTE — Telephone Encounter (Signed)
PA initiated via Covermymeds; KEY: [id number]. PA cancelled. Already approved. Effective 04/09/19 to 04/08/2022.

## 2019-04-09 NOTE — Telephone Encounter (Signed)
PA initiated via Covermymeds; KEY: [id number]. Awaiting determination.

## 2019-04-09 NOTE — Telephone Encounter (Signed)
Andoderm refill requests. Pt on 4mg /24hr patch AND 2mg /24hr patch.  Last OV: 07/19/2018 Last Fill on both: 07/19/2018 #90patch and 0RF

## 2019-04-09 NOTE — Telephone Encounter (Signed)
PA approved. Effective 04/09/19 to 04/08/2022.

## 2019-04-09 NOTE — Telephone Encounter (Signed)
Gregory Santos, please call the patient. Advise him I sent 1 month supply of testosterone, needs to be seen before 1 month if he requires further RFs

## 2019-04-10 NOTE — Telephone Encounter (Signed)
Called pt. He confirmed he would call back and get an appt after looking at his work schedule

## 2019-06-06 ENCOUNTER — Other Ambulatory Visit: Payer: Self-pay | Admitting: Internal Medicine

## 2019-07-11 ENCOUNTER — Telehealth: Payer: Self-pay

## 2019-07-17 ENCOUNTER — Ambulatory Visit: Payer: Commercial Managed Care - PPO | Admitting: Internal Medicine

## 2019-08-08 ENCOUNTER — Telehealth: Payer: Self-pay | Admitting: Internal Medicine

## 2019-08-08 MED ORDER — SIMVASTATIN 40 MG PO TABS: 20.0000 mg | ORAL_TABLET | Freq: Every day | ORAL | 0 refills | Status: DC

## 2019-08-08 MED ORDER — CLOTRIMAZOLE-BETAMETHASONE 1-0.05 % EX CREA: TOPICAL_CREAM | Freq: Every day | CUTANEOUS | 0 refills | Status: DC | PRN

## 2019-08-08 MED ORDER — HYDROCORTISONE (PERIANAL) 2.5 % EX CREA: TOPICAL_CREAM | Freq: Two times a day (BID) | CUTANEOUS | 0 refills | Status: DC | PRN

## 2019-08-08 NOTE — Telephone Encounter (Signed)
Rxs sent

## 2019-08-08 NOTE — Telephone Encounter (Signed)
Patient states that he would like a few pill until appointment date , Please Advise     Medication: clotrimazole-betamethasone (LOTRISONE) cream [[id number]]   hydrocortisone (ANUSOL-HC) 2.5 % rectal cream [[id number]]   simvastatin (ZOCOR) 40 MG tablet [[id number]   Has the patient contacted their pharmacy? No. (If no, request that the patient contact the pharmacy for the refill.) (If yes, when and what did the pharmacy advise?)  Preferred Pharmacy (with phone number or street name): [id number] [name], [place] - [address]  [address] [place] [address]  Phone:  [phone] Fax:  [phone]  DEA #:  [id number]  Agent: Please be advised that RX refills may take up to 3 business days. We ask that you follow-up with your pharmacy.

## 2019-08-21 ENCOUNTER — Other Ambulatory Visit: Payer: Self-pay

## 2019-08-21 ENCOUNTER — Ambulatory Visit (INDEPENDENT_AMBULATORY_CARE_PROVIDER_SITE_OTHER): Payer: Managed Care, Other (non HMO) | Admitting: Internal Medicine

## 2019-08-21 ENCOUNTER — Encounter: Payer: Self-pay | Admitting: Internal Medicine

## 2019-08-21 VITALS — BP 138/88 | HR 54 | Temp 98.4°F | Resp 16 | Ht 71.0 in | Wt 252.2 lb

## 2019-08-21 DIAGNOSIS — B009 Herpesviral infection, unspecified: Secondary | ICD-10-CM

## 2019-08-21 DIAGNOSIS — E291 Testicular hypofunction: Secondary | ICD-10-CM

## 2019-08-21 DIAGNOSIS — E7800 Pure hypercholesterolemia, unspecified: Secondary | ICD-10-CM | POA: Diagnosis not present

## 2019-08-21 DIAGNOSIS — R739 Hyperglycemia, unspecified: Secondary | ICD-10-CM | POA: Diagnosis not present

## 2019-08-21 DIAGNOSIS — E669 Obesity, unspecified: Secondary | ICD-10-CM | POA: Diagnosis not present

## 2019-08-21 DIAGNOSIS — F411 Generalized anxiety disorder: Secondary | ICD-10-CM

## 2019-08-21 LAB — COMPREHENSIVE METABOLIC PANEL
ALT: 8 U/L (ref 0–53)
AST: 13 U/L (ref 0–37)
Albumin: 4.3 g/dL (ref 3.5–5.2)
Alkaline Phosphatase: 63 U/L (ref 39–117)
BUN: 10 mg/dL (ref 6–23)
CO2: 29 mEq/L (ref 19–32)
Calcium: 9.6 mg/dL (ref 8.4–10.5)
Chloride: 102 mEq/L (ref 96–112)
Creatinine, Ser: 0.86 mg/dL (ref 0.40–1.50)
GFR: 112.82 mL/min (ref >60.00)
Glucose, Bld: 103 mg/dL — ABNORMAL HIGH (ref 70–99)
Potassium: 4.4 mEq/L (ref 3.5–5.1)
Sodium: 136 mEq/L (ref 135–145)
Total Bilirubin: 0.5 mg/dL (ref 0.2–1.2)
Total Protein: 7.2 g/dL (ref 6.0–8.3)

## 2019-08-21 LAB — CBC WITH DIFFERENTIAL/PLATELET
Basophils Absolute: 0 10*3/uL (ref 0.0–0.1)
Basophils Relative: 0.3 % (ref 0.0–3.0)
Eosinophils Absolute: 0.1 10*3/uL (ref 0.0–0.7)
Eosinophils Relative: 1.9 % (ref 0.0–5.0)
HCT: 37.9 % — ABNORMAL LOW (ref 39.0–52.0)
Hemoglobin: 12.9 g/dL — ABNORMAL LOW (ref 13.0–17.0)
Lymphocytes Relative: 21.5 % (ref 12.0–46.0)
Lymphs Abs: 1.7 10*3/uL (ref 0.7–4.0)
MCHC: 34 g/dL (ref 30.0–36.0)
MCV: 87.4 fl (ref 78.0–100.0)
Monocytes Absolute: 0.4 10*3/uL (ref 0.1–1.0)
Monocytes Relative: 5.4 % (ref 3.0–12.0)
Neutro Abs: 5.7 10*3/uL (ref 1.4–7.7)
Neutrophils Relative %: 70.9 % (ref 43.0–77.0)
Platelets: 329 10*3/uL (ref 150.0–400.0)
RBC: 4.34 Mil/uL (ref 4.22–5.81)
RDW: 13.8 % (ref 11.5–15.5)
WBC: 8 10*3/uL (ref 4.0–10.5)

## 2019-08-21 LAB — LIPID PANEL
Cholesterol: 169 mg/dL (ref 0–200)
HDL: 44.4 mg/dL (ref >39.00)
LDL Cholesterol: 113 mg/dL — ABNORMAL HIGH (ref 0–99)
NonHDL: 124.29
Total CHOL/HDL Ratio: 4
Triglycerides: 57 mg/dL (ref 0.0–149.0)
VLDL: 11.4 mg/dL (ref 0.0–40.0)

## 2019-08-21 LAB — HEMOGLOBIN A1C: Hgb A1c MFr Bld: 6.2 % (ref 4.6–6.5)

## 2019-08-21 LAB — PSA: PSA: 0.51 ng/mL (ref 0.10–4.00)

## 2019-08-21 MED ORDER — ACYCLOVIR 200 MG PO CAPS: 200.0000 mg | ORAL_CAPSULE | Freq: Two times a day (BID) | ORAL | 6 refills | Status: DC

## 2019-08-21 NOTE — Patient Instructions (Signed)
Check the  blood pressure weekly BP GOAL is between 110/65 and  135/85. If it is consistently higher or lower, let me know  Go back on vitamin D daily  We will refill your hormones as soon as the blood work is back  GO TO THE LAB : Get the blood work     GO TO THE FRONT DESK, PLEASE SCHEDULE YOUR APPOINTMENTS Come back for for a checkup in 3 months

## 2019-08-21 NOTE — Progress Notes (Signed)
Subjective:    Patient ID: Gregory Santos, male    DOB: October 05, 1965, 54 y.o.   MRN: [id number]  DOS:  08/21/2019 Type of visit - description: Follow-up, last visit more than a year ago He ran out of essentially all his medications. Has gained weight, admits to being less active. Anxiety: We had a long conversation about some marital issues. In general feels well   Wt Readings from Last 3 Encounters:  08/21/19 (!) 252 lb 4 oz (114.4 kg)  09/20/17 234 lb (106.1 kg)  03/15/17 232 lb 8 oz (105.5 kg)     Review of Systems Denies chest pain or difficulty breathing No nausea, vomiting or diarrhea No dysuria gross hematuria difficulty urinating  Past Medical History:  Diagnosis Date  . Anxiety   . BELLS PALSY 05/25/2007   Qualifier: History of  By: Kriste Basque MD, Lonzo Cloud   . Constipation   . Headache(784.0)   . Hemorrhoids   . History of Bell's palsy   . History of iron deficiency   . HSV (herpes simplex virus) infection   . Hypercholesteremia   . Obesity   . OSA (obstructive sleep apnea)   . Restless leg syndrome   . Testosterone deficiency   . Vitamin D deficiency     Past Surgical History:  Procedure Laterality Date  . right inguinal hernia repair as a child      Allergies as of 08/21/2019      Reactions   Codeine Phosphate    REACTION: nausea and vomiting      Medication List       Accurate as of August 21, 2019  8:57 AM. If you have any questions, ask your nurse or doctor.        acyclovir 200 MG capsule Commonly known as: ZOVIRAX TAKE 1 CAPSULE BY MOUTH TWICE A DAY   acyclovir ointment 5 % Commonly known as: ZOVIRAX APPLY TOPICALLY EVERY 3  HOURS AS DIRECTED   Androderm 4 MG/24HR Pt24 patch Generic drug: testosterone APPLY 1 PATCH ONTO THE SKIN DAILY WITH 2MG  PATCH FOR TOTAL DOSE OF 6MG  DAILY   Androderm 2 MG/24HR Pt24 Generic drug: Testosterone APPLY 1 PATCH ONTO SKIN DAILY ALONG WITH 4MG  PATCH FOR TOTAL OF 6MG  DAILY   cholecalciferol 1000 units  tablet Commonly known as: VITAMIN D Take 2,000 Units daily by mouth.   clotrimazole-betamethasone cream Commonly known as: LOTRISONE Apply topically daily as needed.   hydrocortisone 2.5 % rectal cream Commonly known as: ANUSOL-HC Place rectally 2 (two) times daily as needed for hemorrhoids or anal itching.   polyethylene glycol 17 g packet Commonly known as: MIRALAX / GLYCOLAX Take 17 g daily as needed by mouth.   sildenafil 20 MG tablet Commonly known as: REVATIO TAKE 3 TO 4 TABLETS BY MOUTH AT BEDTIME AS NEEDED   simvastatin 40 MG tablet Commonly known as: ZOCOR Take 0.5 tablets (20 mg total) by mouth at bedtime.          Objective:   Physical Exam BP (!) 159/103 (BP Location: Left Arm, Patient Position: Sitting, Cuff Size: Normal)   Pulse 54   Temp 98.4 F (36.9 C) (Oral)   Resp 16   Ht 5\' 11"  (1.803 m)   Wt (!) 252 lb 4 oz (114.4 kg)   SpO2 99%   BMI 35.18 kg/m  General:   Well developed, NAD, BMI noted. HEENT:  Normocephalic . Face symmetric, atraumatic Lungs:  CTA B Normal respiratory effort, no intercostal retractions, no accessory  muscle use. Heart: RRR,  no murmur.  Lower extremities: no pretibial edema bilaterally  Skin: Not pale. Not jaundice DRE: Quite limited, seems normal. Neurologic:  alert & oriented X3.  Speech normal, gait appropriate for age and unassisted Psych--  Cognition and judgment appear intact.  Cooperative with normal attention span and concentration.  Behavior appropriate. No anxious or depressed appearing.      Assessment     Assessment  Hyperglycemia, A1c 5.9  hyperlipidemia Anxiety OSA, RLS----sleep study 2006 w/ RDI=17 & mod snoring w/ signif leg jerks; never tried CPAP, uses  Requip  "prn"; states snoring is gone w/ wt reduction  Obesity Low testosterone (f/u pcp) -->   known hx atrophic testes likely from mumps orchitis, on HRT --> sx , improved subjectively Genital HSV -- acyclovir qd , acyclovir cream H/o  Recurrent cough H/o hemorrhoids  H/o Vitamin D deficiency 2010 H/o Bell's palsy Nail dystrophy: penlac ,creams prn +FH CAD Father   PLAN Hyperglycemia: Check A1c Elevated BP: BP today slightly elevated, last time it was checked a month ago was normal.  Recommend to check at home. Hyperlipidemia: Ran out of simvastatin long ago, recheck a CMP, FLP, restart statins with results, will try atorvastatin. Anxiety: Today we had a long conversation, having some marital and  Other issues in his life, listening therapy provided, seems like he might benefit from counseling but he states he will not do that.  No suicidal or homicidal ideas. Obesity: Has gained weight, plans to go back in track w/ more activity and eating healthy Low testosterone: Out of HRT for a while, checking a CBC, PSA, free T and total T. He feels okay, I asked then why would he need to go back on HRT, he states that he felt even better with hormone replacement.  ED: Refill sildenafil although admits that his problems are minimal Genital HSV: Run out of Zovirax long time ago, had one  small break, would like to go back to daily Zovirax. RTC 3 months  This visit occurred during the SARS-CoV-2 public health emergency.  Safety protocols were in place, including screening questions prior to the visit, additional usage of staff PPE, and extensive cleaning of exam room while observing appropriate contact time as indicated for disinfecting solutions.

## 2019-08-21 NOTE — Progress Notes (Signed)
Pre visit review using our clinic review tool, if applicable. No additional management support is needed unless otherwise documented below in the visit note. 

## 2019-08-22 LAB — TESTOSTERONE TOTAL,FREE,BIO, MALES
Albumin: 4.4 g/dL (ref 3.6–5.1)
Sex Hormone Binding: 52 nmol/L — ABNORMAL HIGH (ref 10–50)
Testosterone: 183 ng/dL — ABNORMAL LOW (ref 250–827)

## 2019-08-22 NOTE — Assessment & Plan Note (Signed)
Hyperglycemia: Check A1c Elevated BP: BP today slightly elevated, last time it was checked a month ago was normal.  Recommend to check at home. Hyperlipidemia: Ran out of simvastatin long ago, recheck a CMP, FLP, restart statins with results, will try atorvastatin. Anxiety: Today we had a long conversation, having some marital and  Other issues in his life, listening therapy provided, seems like he might benefit from counseling but he states he will not do that.  No suicidal or homicidal ideas. Obesity: Has gained weight, plans to go back in track w/ more activity and eating healthy Low testosterone: Out of HRT for a while, checking a CBC, PSA, free T and total T. He feels okay, I asked then why would he need to go back on HRT, he states that he felt even better with hormone replacement.  ED: Refill sildenafil although admits that his problems are minimal Genital HSV: Run out of Zovirax long time ago, had one  small break, would like to go back to daily Zovirax. RTC 3 months

## 2019-08-24 ENCOUNTER — Other Ambulatory Visit: Payer: Self-pay | Admitting: Internal Medicine

## 2019-08-24 MED ORDER — ATORVASTATIN CALCIUM 10 MG PO TABS
10.0000 mg | ORAL_TABLET | Freq: Every day | ORAL | 3 refills | Status: DC
Start: 2019-08-24 — End: 2020-01-01

## 2019-08-24 MED ORDER — ANDRODERM 4 MG/24HR TD PT24: MEDICATED_PATCH | TRANSDERMAL | 3 refills | Status: DC

## 2019-08-24 MED ORDER — ANDRODERM 2 MG/24HR TD PT24: MEDICATED_PATCH | TRANSDERMAL | 3 refills | Status: DC

## 2019-08-24 NOTE — Addendum Note (Signed)
Addended byConrad Pomaria D on: 08/24/2019 05:13 PM   Modules accepted: Orders

## 2019-10-12 ENCOUNTER — Telehealth: Payer: Self-pay | Admitting: Internal Medicine

## 2019-10-12 ENCOUNTER — Other Ambulatory Visit: Payer: Self-pay | Admitting: Internal Medicine

## 2019-10-12 MED ORDER — ACYCLOVIR 5 % EX OINT: TOPICAL_OINTMENT | CUTANEOUS | 0 refills | Status: DC

## 2019-10-12 MED ORDER — HYDROCORTISONE (PERIANAL) 2.5 % EX CREA: TOPICAL_CREAM | Freq: Two times a day (BID) | CUTANEOUS | 0 refills | Status: DC | PRN

## 2019-10-12 NOTE — Telephone Encounter (Signed)
Rxs sent

## 2019-10-12 NOTE — Telephone Encounter (Signed)
Medication:  acyclovir ointment (ZOVIRAX) 5 % [[id number]]   PROCTOSOL HC 2.5 % rectal cream [[id number]     Has the patient contacted their pharmacy?  (If no, request that the patient contact the pharmacy for the refill.) (If yes, when and what did the pharmacy advise?)     Preferred Pharmacy (with phone number or street name): [id number] [name], [place] - [address]  [address] [place] [address]  Phone:  [phone] Fax:  [phone]      Agent: Please be advised that RX refills may take up to 3 business days. We ask that you follow-up with your pharmacy.

## 2019-10-26 ENCOUNTER — Other Ambulatory Visit: Payer: Self-pay | Admitting: Internal Medicine

## 2019-12-04 ENCOUNTER — Telehealth: Payer: Self-pay | Admitting: Internal Medicine

## 2019-12-04 NOTE — Telephone Encounter (Signed)
Patient was screaming and upset at the fact that he need to see Dr.Paz to get more medication. Patient states he is only supposed to come twice a year. Patient was mad at the fact the Dr. Drue Novel only gave him 40 pills on Oct 1.  States he wants to know why? I reinstated note typed on last prescription. Patient was upset and hang out the phone blurring just fill my medication.     Medication: sildenafil (REVATIO) 20 MG tablet [[id number]]      Has the patient contacted their pharmacy?  (If no, request that the patient contact the pharmacy for the refill.) (If yes, when and what did the pharmacy advise?)     Preferred Pharmacy (with phone number or street name):  [facility] [address] - [place], [place] - [address]  [address], [place] [address]  Phone:  [phone] Fax:  [phone]    Agent: Please be advised that RX refills may take up to 3 business days. We ask that you follow-up with your pharmacy.

## 2019-12-04 NOTE — Telephone Encounter (Signed)
Patient seen 07-2019, was recommended to RTC 3 months

## 2019-12-07 ENCOUNTER — Encounter: Payer: Self-pay | Admitting: Internal Medicine

## 2019-12-19 ENCOUNTER — Ambulatory Visit: Payer: Managed Care, Other (non HMO) | Admitting: Internal Medicine

## 2020-01-01 ENCOUNTER — Telehealth: Payer: Self-pay | Admitting: Internal Medicine

## 2020-01-01 DIAGNOSIS — E7800 Pure hypercholesterolemia, unspecified: Secondary | ICD-10-CM

## 2020-01-01 DIAGNOSIS — E291 Testicular hypofunction: Secondary | ICD-10-CM

## 2020-01-01 MED ORDER — ATORVASTATIN CALCIUM 10 MG PO TABS
10.0000 mg | ORAL_TABLET | Freq: Every day | ORAL | 2 refills | Status: DC
Start: 2020-01-01 — End: 2020-06-02

## 2020-01-01 MED ORDER — ANDRODERM 2 MG/24HR TD PT24
MEDICATED_PATCH | TRANSDERMAL | 1 refills | Status: DC
Start: 2020-01-01 — End: 2020-06-02

## 2020-01-01 MED ORDER — ANDRODERM 4 MG/24HR TD PT24
MEDICATED_PATCH | TRANSDERMAL | 1 refills | Status: DC
Start: 2020-01-01 — End: 2020-06-02

## 2020-01-01 MED ORDER — ACYCLOVIR 200 MG PO CAPS
200.0000 mg | ORAL_CAPSULE | Freq: Two times a day (BID) | ORAL | 2 refills | Status: DC
Start: 2020-01-01 — End: 2020-06-02

## 2020-01-01 MED ORDER — HYDROCORTISONE (PERIANAL) 2.5 % EX CREA: TOPICAL_CREAM | Freq: Two times a day (BID) | CUTANEOUS | 2 refills | Status: DC | PRN

## 2020-01-01 MED ORDER — ACYCLOVIR 5 % EX OINT
TOPICAL_OINTMENT | CUTANEOUS | 2 refills | Status: DC
Start: 2020-01-01 — End: 2021-12-22

## 2020-01-01 MED ORDER — SILDENAFIL CITRATE 20 MG PO TABS
60.0000 mg | ORAL_TABLET | Freq: Every evening | ORAL | 2 refills | Status: DC | PRN
Start: 2020-01-01 — End: 2020-06-02

## 2020-01-01 NOTE — Telephone Encounter (Signed)
Advise patient: 1.  Needs labs: FLP, AST, ALT (high cholesterol), free and total testosterone (hypogonadism).  If is more convenient to him, he can walk-in to the [place] office. 2.  While I understand his job situation, it is my responsibility to prescribe his medications responsibly, needs office visit 2 months from today.  If he is unwilling to come back for checkups I won't be  able to help him. 3.  Send a 39-month supply of meds 4.  He has been very upset before, if he is disrespectful to my staff let me know.

## 2020-01-01 NOTE — Telephone Encounter (Signed)
Spoke w/ Pt- informed of recommendations. Pt verbalized understanding. He will go as soon as he can. Also [facility]break" [facility] Informed I'd send that for 2 months. Pt verbalized understanding.

## 2020-01-01 NOTE — Telephone Encounter (Signed)
Orders placed. Will attempt to reach out to Pt.

## 2020-01-01 NOTE — Telephone Encounter (Signed)
Please advise 

## 2020-01-01 NOTE — Telephone Encounter (Signed)
Patient states he is not able to take off work to come to a doctor appointment. Patient states his job will give him points and he could lose his job. Patient states he can't lose his job over a doctors appt. Patient states he needs refills on his medication.   Two Different pharmacy   sildenafil (REVATIO) 20 MG tablet [[id number]  [facility] [address] - [place], [place] - [address]  [address], [place] [address]  Phone:  [phone] Fax:  [phone]   testosterone (ANDRODERM) 4 MG/24HR PT24 patch [[id number]]   Testosterone (ANDRODERM) 2 MG/24HR PT24 [[id number]]   hydrocortisone (ANUSOL-HC) 2.5 % rectal cream [[id number]]   atorvastatin (LIPITOR) 10 MG tablet [[id number]]   [id number] [name], [place] - [address]  [address] [place] [address]  Phone:  [phone] Fax:  [phone]

## 2020-04-04 ENCOUNTER — Encounter: Payer: Self-pay | Admitting: Internal Medicine

## 2020-04-28 ENCOUNTER — Other Ambulatory Visit: Payer: Self-pay | Admitting: Internal Medicine

## 2020-05-02 ENCOUNTER — Other Ambulatory Visit: Payer: Self-pay | Admitting: Internal Medicine

## 2020-05-28 ENCOUNTER — Encounter: Payer: Self-pay | Admitting: Internal Medicine

## 2020-05-28 ENCOUNTER — Ambulatory Visit (INDEPENDENT_AMBULATORY_CARE_PROVIDER_SITE_OTHER): Payer: Managed Care, Other (non HMO) | Admitting: Internal Medicine

## 2020-05-28 ENCOUNTER — Other Ambulatory Visit: Payer: Self-pay

## 2020-05-28 VITALS — BP 131/84 | HR 57 | Temp 97.6°F | Ht 71.0 in | Wt 240.0 lb

## 2020-05-28 DIAGNOSIS — E559 Vitamin D deficiency, unspecified: Secondary | ICD-10-CM

## 2020-05-28 DIAGNOSIS — E291 Testicular hypofunction: Secondary | ICD-10-CM | POA: Diagnosis not present

## 2020-05-28 DIAGNOSIS — R739 Hyperglycemia, unspecified: Secondary | ICD-10-CM | POA: Diagnosis not present

## 2020-05-28 DIAGNOSIS — E7800 Pure hypercholesterolemia, unspecified: Secondary | ICD-10-CM

## 2020-05-28 DIAGNOSIS — Z7185 Encounter for immunization safety counseling: Secondary | ICD-10-CM

## 2020-05-28 DIAGNOSIS — Z1211 Encounter for screening for malignant neoplasm of colon: Secondary | ICD-10-CM

## 2020-05-28 LAB — CBC WITH DIFFERENTIAL/PLATELET
Basophils Absolute: 0 10*3/uL (ref 0.0–0.1)
Basophils Relative: 0.4 % (ref 0.0–3.0)
Eosinophils Absolute: 0.2 10*3/uL (ref 0.0–0.7)
Eosinophils Relative: 2.7 % (ref 0.0–5.0)
HCT: 42.2 % (ref 39.0–52.0)
Hemoglobin: 14.2 g/dL (ref 13.0–17.0)
Lymphocytes Relative: 22.2 % (ref 12.0–46.0)
Lymphs Abs: 1.8 10*3/uL (ref 0.7–4.0)
MCHC: 33.7 g/dL (ref 30.0–36.0)
MCV: 87.6 fl (ref 78.0–100.0)
Monocytes Absolute: 0.4 10*3/uL (ref 0.1–1.0)
Monocytes Relative: 5.4 % (ref 3.0–12.0)
Neutro Abs: 5.6 10*3/uL (ref 1.4–7.7)
Neutrophils Relative %: 69.3 % (ref 43.0–77.0)
Platelets: 356 10*3/uL (ref 150.0–400.0)
RBC: 4.81 Mil/uL (ref 4.22–5.81)
RDW: 13.8 % (ref 11.5–15.5)
WBC: 8 10*3/uL (ref 4.0–10.5)

## 2020-05-28 LAB — VITAMIN D 25 HYDROXY (VIT D DEFICIENCY, FRACTURES): VITD: 33.32 ng/mL (ref 30.00–100.00)

## 2020-05-28 LAB — LIPID PANEL
Cholesterol: 180 mg/dL (ref 0–200)
HDL: 45.1 mg/dL (ref >39.00)
LDL Cholesterol: 124 mg/dL — ABNORMAL HIGH (ref 0–99)
NonHDL: 134.89
Total CHOL/HDL Ratio: 4
Triglycerides: 53 mg/dL (ref 0.0–149.0)
VLDL: 10.6 mg/dL (ref 0.0–40.0)

## 2020-05-28 LAB — AST: AST: 14 U/L (ref 0–37)

## 2020-05-28 LAB — HEMOGLOBIN A1C: Hgb A1c MFr Bld: 6.2 % (ref 4.6–6.5)

## 2020-05-28 LAB — PSA: PSA: 0.65 ng/mL (ref 0.10–4.00)

## 2020-05-28 LAB — ALT: ALT: 9 U/L (ref 0–53)

## 2020-05-28 LAB — TESTOSTERONE: Testosterone: 398.25 ng/dL (ref 300.00–890.00)

## 2020-05-28 NOTE — Progress Notes (Signed)
Subjective:    Patient ID: Gregory Santos, male    DOB: 1965-10-26, 55 y.o.   MRN: [id number]  DOS:  05/28/2020 Type of visit - description: Routine visit  Today we discussed all his chronic medical problems. We also talk about vaccinations, diet and exercise.   Wt Readings from Last 3 Encounters:  05/28/20 240 lb (108.9 kg)  08/21/19 (!) 252 lb 4 oz (114.4 kg)  09/20/17 234 lb (106.1 kg)     Review of Systems Denies dysuria, gross and nocturia or difficulty urinating. Energy level is normal.   Past Medical History:  Diagnosis Date  . Anxiety   . BELLS PALSY 05/25/2007   Qualifier: History of  By: Kriste Basque MD, Lonzo Cloud   . Constipation   . Headache(784.0)   . Hemorrhoids   . History of Bell's palsy   . History of iron deficiency   . HSV (herpes simplex virus) infection   . Hypercholesteremia   . Obesity   . OSA (obstructive sleep apnea)   . Restless leg syndrome   . Testosterone deficiency   . Vitamin D deficiency     Past Surgical History:  Procedure Laterality Date  . right inguinal hernia repair as a child      Allergies as of 05/28/2020      Reactions   Codeine Phosphate    REACTION: nausea and vomiting      Medication List       Accurate as of May 28, 2020 11:59 PM. If you have any questions, ask your nurse or doctor.        acyclovir 200 MG capsule Commonly known as: ZOVIRAX Take 1 capsule (200 mg total) by mouth 2 (two) times daily.   acyclovir ointment 5 % Commonly known as: ZOVIRAX APPLY TOPICALLY EVERY 3  HOURS AS DIRECTED   Androderm 4 MG/24HR Pt24 patch Generic drug: testosterone Apply 1 patch daily along with a 2 mg patch for a total of 6 mg daily   Androderm 2 MG/24HR Pt24 Generic drug: Testosterone Apply 1 patch on the skin daily along with a 4 mg patch (total 6 mg daily   atorvastatin 10 MG tablet Commonly known as: LIPITOR Take 1 tablet (10 mg total) by mouth at bedtime.   cholecalciferol 1000 units tablet Commonly known as:  VITAMIN D Take 2,000 Units by mouth daily.   hydrocortisone 2.5 % rectal cream Commonly known as: ANUSOL-HC Place rectally 2 (two) times daily as needed for hemorrhoids or anal itching.   sildenafil 20 MG tablet Commonly known as: REVATIO Take 3-4 tablets (60-80 mg total) by mouth at bedtime as needed.          Objective:   Physical Exam BP 131/84 (BP Location: Right Arm, Patient Position: Sitting, Cuff Size: Large)   Pulse (!) 57   Temp 97.6 F (36.4 C) (Temporal)   Ht 5\' 11"  (1.803 m)   Wt 240 lb (108.9 kg)   SpO2 100%   BMI 33.47 kg/m  General:   Well developed, NAD, BMI noted. HEENT:  Normocephalic . Face symmetric, atraumatic Lungs:  CTA B Normal respiratory effort, no intercostal retractions, no accessory muscle use. Heart: RRR,  no murmur.  Lower extremities: no pretibial edema bilaterally  Skin: Not pale. Not jaundice Neurologic:  alert & oriented X3.  Speech normal, gait appropriate for age and unassisted Psych--  Cognition and judgment appear intact.  Cooperative with normal attention span and concentration.  Behavior appropriate. No anxious or depressed appearing.  Assessment     Assessment  Hyperglycemia, A1c 5.9  hyperlipidemia Anxiety OSA, RLS----sleep study 2006 w/ RDI=17 & mod snoring w/ signif leg jerks; never tried CPAP, uses  Requip  "prn"; states snoring is gone w/ wt reduction  Obesity Low testosterone (f/u pcp) -->   known hx atrophic testes likely from mumps orchitis, on HRT --> sx , improved subjectively Genital HSV -- acyclovir qd , acyclovir cream Vitamin D deficiency 2010 H/o Recurrent cough H/o hemorrhoids  H/o Bell's palsy Nail dystrophy: penlac ,creams prn +FH CAD Father   PLAN Hyperglycemia: Very active at work, with talk about diet,   noted some weight loss, praised.  Check A1c Hyperlipidemia: Reports takes atorvastatin regularly except for the last 10 days when he ran out, checking FLP, AST, ALT Low testosterone:  Good compliance with HRT, check total testosterone, PSA, CBC. Vitamin D deficiency: On OTCs, vitamin D 2000 units daily, checking labs. Preventive care: Due for C-scope, referred. Vaccine counseling, recommend to proceed with a COVID booster RTC 3 months CPX   This visit occurred during the SARS-CoV-2 public health emergency.  Safety protocols were in place, including screening questions prior to the visit, additional usage of staff PPE, and extensive cleaning of exam room while observing appropriate contact time as indicated for disinfecting solutions.

## 2020-05-28 NOTE — Patient Instructions (Addendum)
Continue checking your blood pressure BP GOAL is between 110/65 and  135/85. If it is consistently higher or lower, let me know  Please schedule your colonoscopy, call the gastroenterology office at [phone]  Proceed with your COVID vaccination downstairs   GO TO THE LAB : Get the blood work     GO TO THE FRONT DESK, PLEASE SCHEDULE YOUR APPOINTMENTS Come back for for physical exam in 3 months

## 2020-05-29 NOTE — Assessment & Plan Note (Signed)
Hyperglycemia: Very active at work, with talk about diet,   noted some weight loss, praised.  Check A1c Hyperlipidemia: Reports takes atorvastatin regularly except for the last 10 days when he ran out, checking FLP, AST, ALT Low testosterone: Good compliance with HRT, check total testosterone, PSA, CBC. Vitamin D deficiency: On OTCs, vitamin D 2000 units daily, checking labs. Preventive care: Due for C-scope, referred. Vaccine counseling, recommend to proceed with a COVID booster RTC 3 months CPX

## 2020-06-02 ENCOUNTER — Telehealth: Payer: Self-pay

## 2020-06-02 MED ORDER — ANDRODERM 4 MG/24HR TD PT24: MEDICATED_PATCH | TRANSDERMAL | 5 refills | Status: DC

## 2020-06-02 MED ORDER — ACYCLOVIR 200 MG PO CAPS: 200.0000 mg | ORAL_CAPSULE | Freq: Two times a day (BID) | ORAL | 1 refills | Status: DC

## 2020-06-02 MED ORDER — ATORVASTATIN CALCIUM 20 MG PO TABS: 20.0000 mg | ORAL_TABLET | Freq: Every day | ORAL | 1 refills | Status: DC

## 2020-06-02 MED ORDER — SILDENAFIL CITRATE 20 MG PO TABS: 60.0000 mg | ORAL_TABLET | Freq: Every evening | ORAL | 2 refills | Status: DC | PRN

## 2020-06-02 MED ORDER — ANDRODERM 2 MG/24HR TD PT24: MEDICATED_PATCH | TRANSDERMAL | 5 refills | Status: DC

## 2020-06-02 NOTE — Addendum Note (Signed)
Addended byConrad Dundalk D on: 06/02/2020 07:50 AM   Modules accepted: Orders

## 2020-06-02 NOTE — Telephone Encounter (Signed)
Requesting: testosterone 4mg /24hr patch and testosterone 2mg /24hr patch Contract: n/a UDS: n/a Last Visit: 05/28/20 Next Visit:  09/02/2020 Last Refill (both): 01/01/2020 #30 and 1RF  Testosterone levels were normal when checked on 05/28/2020 during ov and appears he may have been out, does he need to continue?    Please Advise

## 2020-06-02 NOTE — Telephone Encounter (Signed)
PDMP reviewed, it shows good compliance, RF sent

## 2020-08-07 ENCOUNTER — Other Ambulatory Visit: Payer: Self-pay

## 2020-08-07 MED ORDER — SILDENAFIL CITRATE 20 MG PO TABS: 60.0000 mg | ORAL_TABLET | Freq: Every evening | ORAL | 2 refills | Status: DC | PRN

## 2020-09-02 ENCOUNTER — Encounter: Payer: Self-pay | Admitting: Internal Medicine

## 2020-09-02 ENCOUNTER — Encounter: Payer: Managed Care, Other (non HMO) | Admitting: Internal Medicine

## 2020-10-07 ENCOUNTER — Ambulatory Visit (INDEPENDENT_AMBULATORY_CARE_PROVIDER_SITE_OTHER): Payer: Managed Care, Other (non HMO) | Admitting: Internal Medicine

## 2020-10-07 ENCOUNTER — Other Ambulatory Visit: Payer: Self-pay

## 2020-10-07 ENCOUNTER — Encounter: Payer: Self-pay | Admitting: Internal Medicine

## 2020-10-07 VITALS — BP 148/90 | HR 68 | Temp 98.2°F | Resp 18 | Ht 71.0 in | Wt 257.0 lb

## 2020-10-07 DIAGNOSIS — R5383 Other fatigue: Secondary | ICD-10-CM | POA: Diagnosis not present

## 2020-10-07 DIAGNOSIS — Z1159 Encounter for screening for other viral diseases: Secondary | ICD-10-CM

## 2020-10-07 DIAGNOSIS — Z0000 Encounter for general adult medical examination without abnormal findings: Secondary | ICD-10-CM

## 2020-10-07 DIAGNOSIS — Z23 Encounter for immunization: Secondary | ICD-10-CM | POA: Diagnosis not present

## 2020-10-07 LAB — COMPREHENSIVE METABOLIC PANEL
ALT: 12 U/L (ref 0–53)
AST: 15 U/L (ref 0–37)
Albumin: 4.5 g/dL (ref 3.5–5.2)
Alkaline Phosphatase: 65 U/L (ref 39–117)
BUN: 12 mg/dL (ref 6–23)
CO2: 29 mEq/L (ref 19–32)
Calcium: 9.9 mg/dL (ref 8.4–10.5)
Chloride: 101 mEq/L (ref 96–112)
Creatinine, Ser: 0.87 mg/dL (ref 0.40–1.50)
GFR: 98.5 mL/min (ref >60.00)
Glucose, Bld: 113 mg/dL — ABNORMAL HIGH (ref 70–99)
Potassium: 4.5 mEq/L (ref 3.5–5.1)
Sodium: 137 mEq/L (ref 135–145)
Total Bilirubin: 0.8 mg/dL (ref 0.2–1.2)
Total Protein: 7.5 g/dL (ref 6.0–8.3)

## 2020-10-07 LAB — LIPID PANEL
Cholesterol: 142 mg/dL (ref 0–200)
HDL: 47.5 mg/dL (ref >39.00)
LDL Cholesterol: 84 mg/dL (ref 0–99)
NonHDL: 94.38
Total CHOL/HDL Ratio: 3
Triglycerides: 50 mg/dL (ref 0.0–149.0)
VLDL: 10 mg/dL (ref 0.0–40.0)

## 2020-10-07 LAB — TSH: TSH: 1.09 u[IU]/mL (ref 0.35–5.50)

## 2020-10-07 NOTE — Progress Notes (Signed)
Subjective:    Patient ID: Gregory Santos, male    DOB: 09/21/1965, 55 y.o.   MRN: [id number]  DOS:  10/07/2020 Type of visit - description: cpx Here for CPX. Has no major concerns.  BP Readings from Last 3 Encounters:  10/07/20 (!) 148/90  05/28/20 131/84  08/21/19 (!) 138/88    Review of Systems   A 14 point review of systems is negative     Past Medical History:  Diagnosis Date   Anxiety    BELLS PALSY 05/25/2007   Qualifier: History of  By: Kriste Basque MD, Lonzo Cloud    Constipation    Headache(784.0)    Hemorrhoids    History of Bell's palsy    History of iron deficiency    HSV (herpes simplex virus) infection    Hypercholesteremia    Obesity    OSA (obstructive sleep apnea)    Restless leg syndrome    Testosterone deficiency    Vitamin D deficiency     Past Surgical History:  Procedure Laterality Date   right inguinal hernia repair as a child     Social History   Socioeconomic History   Marital status: Married    Spouse name: Not on file   Number of children: 0   Years of education: Not on file   Highest education level: Not on file  Occupational History   Occupation: city transfer , record mngmt   Tobacco Use   Smoking status: Never   Smokeless tobacco: Never  Substance and Sexual Activity   Alcohol use: Yes    Alcohol/week: 0.0 standard drinks    Comment: rare wine   Drug use: No   Sexual activity: Not on file  Other Topics Concern   Not on file  Social History Narrative   Lives w/ wife   Social Determinants of Health   Financial Resource Strain: Not on file  Food Insecurity: Not on file  Transportation Needs: Not on file  Physical Activity: Not on file  Stress: Not on file  Social Connections: Not on file  Intimate Partner Violence: Not on file     Allergies as of 10/07/2020       Reactions   Codeine Phosphate    REACTION: nausea and vomiting        Medication List        Accurate as of October 07, 2020  8:00 PM. If you  have any questions, ask your nurse or doctor.          acyclovir 200 MG capsule Commonly known as: ZOVIRAX Take 1 capsule (200 mg total) by mouth 2 (two) times daily.   acyclovir ointment 5 % Commonly known as: ZOVIRAX APPLY TOPICALLY EVERY 3  HOURS AS DIRECTED   Androderm 4 MG/24HR Pt24 patch Generic drug: testosterone Apply 1 patch daily along with a 2 mg patch for a total of 6 mg daily   Androderm 2 MG/24HR Pt24 Generic drug: Testosterone Apply 1 patch on the skin daily along with a 4 mg patch (total 6 mg daily   atorvastatin 20 MG tablet Commonly known as: LIPITOR Take 1 tablet (20 mg total) by mouth at bedtime.   cholecalciferol 1000 units tablet Commonly known as: VITAMIN D Take 2,000 Units by mouth daily.   hydrocortisone 2.5 % rectal cream Commonly known as: ANUSOL-HC Place rectally 2 (two) times daily as needed for hemorrhoids or anal itching.   sildenafil 20 MG tablet Commonly known as: REVATIO Take 3-4 tablets (60-80 mg  total) by mouth at bedtime as needed.           Objective:   Physical Exam BP (!) 148/90 (BP Location: Left Arm, Patient Position: Sitting, Cuff Size: Normal)   Pulse 68   Temp 98.2 F (36.8 C)   Resp 18   Ht 5\' 11"  (1.803 m)   Wt 257 lb (116.6 kg)   SpO2 99%   BMI 35.84 kg/m  General: Well developed, NAD, BMI noted Neck: No  thyromegaly  HEENT:  Normocephalic . Face symmetric, atraumatic Lungs:  CTA B Normal respiratory effort, no intercostal retractions, no accessory muscle use. Heart: RRR,  no murmur.  Abdomen:  Not distended, soft, non-tender. No rebound or rigidity.   Lower extremities: no pretibial edema bilaterally DRE: Normal sphincter tone, unable to reach prostate due to patient habitus Skin: Exposed areas without rash. Not pale. Not jaundice Neurologic:  alert & oriented X3.  Speech normal, gait appropriate for age and unassisted Strength symmetric and appropriate for age.  Psych: Cognition and judgment  appear intact.  Cooperative with normal attention span and concentration.  Behavior appropriate. No anxious or depressed appearing.     Assessment    Assessment  Hyperglycemia, A1c 5.9  hyperlipidemia Anxiety OSA, RLS----sleep study 2006 w/ RDI=17 & mod snoring w/ signif leg jerks; never tried CPAP, uses  Requip  "prn"; states snoring is gone w/ wt reduction  Obesity Low testosterone (f/u pcp) -->   known hx atrophic testes likely from mumps orchitis, on HRT --> sx , improved subjectively Genital HSV -- acyclovir qd , acyclovir cream Vitamin D deficiency 2010 H/o Recurrent cough H/o hemorrhoids  H/o Bell's palsy Nail dystrophy: penlac ,creams prn +FH CAD Father   PLAN Here for CPX Elevated BP: Recommend to monitor BPs, low-salt diet Hyperglycemia: Recent A1c 6.2, diet advised Hyperlipidemia:Recent LDL 124, Lipitor increased to 20 mg, recheck labs H/o OSA, RLS: never tried a cpap, it is hard to say if he is symptomatic, he works very long hours, no inappropriate during the daytime but as soon as he gets home he feels very sleepy and goes to bed. Reassess sxs periodically. Low testosterone: On HRT, last dose satisfactory, on 6 mg daily. Vitamin D deficiency: On OTCs, last levels okay, no change RTC 4 months    This visit occurred during the SARS-CoV-2 public health emergency.  Safety protocols were in place, including screening questions prior to the visit, additional usage of staff PPE, and extensive cleaning of exam room while observing appropriate contact time as indicated for disinfecting solutions.

## 2020-10-07 NOTE — Patient Instructions (Addendum)
Please call the gastroenterology office to set up a colonoscopy: [phone]  Your blood pressure is a slightly elevated.  Follow a low-salt diet.  Check the  blood pressure 2 or 3 times a week BP GOAL is between 110/65 and  135/85. If it is consistently higher or lower, let me know  Vaccines are recommended: COVID booster Flu shot every fall Shingrix   GO TO THE LAB : Get the blood work     GO TO THE FRONT DESK, PLEASE SCHEDULE YOUR APPOINTMENTS Come back for a checkup in 4 months

## 2020-10-07 NOTE — Assessment & Plan Note (Signed)
-  Td 2015 --Shingrix:d/w pt, #1 today, next on RTC 4 m - COVID vax x 1, booster rec - flu shot  rec -[facility]S: cscope 2010 , Dr Christella Hartigan, 1 polyp, 10 years; GI phone provided, encouraged to call and set up Cscope  -Prostate cancer screening: Recent PSA normal, DRE performed today, unable to reach the prostate. -Labs : CMP, FLP, TSH, hep C -Diet and exercise: Discussed

## 2020-10-07 NOTE — Assessment & Plan Note (Signed)
Here for CPX Elevated BP: Recommend to monitor BPs, low-salt diet Hyperglycemia: Recent A1c 6.2, diet advised Hyperlipidemia:Recent LDL 124, Lipitor increased to 20 mg, recheck labs H/o OSA, RLS: never tried a cpap, it is hard to say if he is symptomatic, he works very long hours, no inappropriate during the daytime but as soon as he gets home he feels very sleepy and goes to bed. Reassess sxs periodically. Low testosterone: On HRT, last dose satisfactory, on 6 mg daily. Vitamin D deficiency: On OTCs, last levels okay, no change RTC 4 months

## 2020-10-08 LAB — HEPATITIS C ANTIBODY
Hepatitis C Ab: NONREACTIVE
SIGNAL TO CUT-OFF: 0.02 (ref <1.00)

## 2020-11-13 ENCOUNTER — Telehealth: Payer: Self-pay | Admitting: Internal Medicine

## 2020-11-13 NOTE — Telephone Encounter (Signed)
noted 

## 2020-11-13 NOTE — Telephone Encounter (Signed)
Pt called regarding ear infection. Pt stated he needs ear drops sent to the pharmacy below.    [facility] [address] [place], [place] [address] [phone]

## 2020-11-13 NOTE — Telephone Encounter (Signed)
Spoke w/ Pt- informed that he needs an appt- states he is unable to take time off of work right now. He will just tough it out- informed he could go after work to local urgent care for treatment. Pt hung up.

## 2020-12-05 ENCOUNTER — Telehealth: Payer: Self-pay | Admitting: Internal Medicine

## 2020-12-05 NOTE — Telephone Encounter (Signed)
Requesting: testosterone 4mg /24hr patch, testosterone 2mg patch Contract: N/A UDS: N/A Last Visit: 10/07/2020 Next Visit: 02/10/2021 Last Refill for both on 06/02/2020 #30 and 5RF  Please Advise

## 2020-12-05 NOTE — Telephone Encounter (Signed)
Pdmp ok, rx sent  ? ?

## 2021-02-10 ENCOUNTER — Ambulatory Visit: Payer: Managed Care, Other (non HMO) | Admitting: Internal Medicine

## 2021-03-01 ENCOUNTER — Other Ambulatory Visit: Payer: Self-pay | Admitting: Internal Medicine

## 2021-03-09 ENCOUNTER — Other Ambulatory Visit: Payer: Self-pay | Admitting: Internal Medicine

## 2021-03-27 ENCOUNTER — Encounter: Payer: Self-pay | Admitting: Internal Medicine

## 2021-03-28 ENCOUNTER — Other Ambulatory Visit: Payer: Self-pay | Admitting: Internal Medicine

## 2021-04-01 ENCOUNTER — Ambulatory Visit (HOSPITAL_BASED_OUTPATIENT_CLINIC_OR_DEPARTMENT_OTHER)
Admission: RE | Admit: 2021-04-01 | Discharge: 2021-04-01 | Disposition: A | Discharge status: Home / Self Care | Payer: Managed Care, Other (non HMO) | Source: Ambulatory Visit | Attending: Internal Medicine | Admitting: Internal Medicine

## 2021-04-01 ENCOUNTER — Encounter: Payer: Self-pay | Admitting: Internal Medicine

## 2021-04-01 ENCOUNTER — Ambulatory Visit (INDEPENDENT_AMBULATORY_CARE_PROVIDER_SITE_OTHER): Payer: Managed Care, Other (non HMO) | Admitting: Internal Medicine

## 2021-04-01 ENCOUNTER — Other Ambulatory Visit: Payer: Self-pay

## 2021-04-01 VITALS — BP 136/80 | HR 68 | Temp 98.4°F | Resp 18 | Ht 71.0 in | Wt 269.2 lb

## 2021-04-01 DIAGNOSIS — M7989 Other specified soft tissue disorders: Secondary | ICD-10-CM

## 2021-04-01 DIAGNOSIS — R739 Hyperglycemia, unspecified: Secondary | ICD-10-CM | POA: Diagnosis not present

## 2021-04-01 DIAGNOSIS — Z23 Encounter for immunization: Secondary | ICD-10-CM

## 2021-04-01 DIAGNOSIS — E291 Testicular hypofunction: Secondary | ICD-10-CM | POA: Diagnosis not present

## 2021-04-01 LAB — CBC WITH DIFFERENTIAL/PLATELET
Basophils Absolute: 0 10*3/uL (ref 0.0–0.1)
Basophils Relative: 0.3 % (ref 0.0–3.0)
Eosinophils Absolute: 0.2 10*3/uL (ref 0.0–0.7)
Eosinophils Relative: 2.2 % (ref 0.0–5.0)
HCT: 41.3 % (ref 39.0–52.0)
Hemoglobin: 13.4 g/dL (ref 13.0–17.0)
Lymphocytes Relative: 21.2 % (ref 12.0–46.0)
Lymphs Abs: 1.8 10*3/uL (ref 0.7–4.0)
MCHC: 32.5 g/dL (ref 30.0–36.0)
MCV: 91 fl (ref 78.0–100.0)
Monocytes Absolute: 0.6 10*3/uL (ref 0.1–1.0)
Monocytes Relative: 6.6 % (ref 3.0–12.0)
Neutro Abs: 6 10*3/uL (ref 1.4–7.7)
Neutrophils Relative %: 69.7 % (ref 43.0–77.0)
Platelets: 350 10*3/uL (ref 150.0–400.0)
RBC: 4.54 Mil/uL (ref 4.22–5.81)
RDW: 14.1 % (ref 11.5–15.5)
WBC: 8.6 10*3/uL (ref 4.0–10.5)

## 2021-04-01 LAB — COMPREHENSIVE METABOLIC PANEL
ALT: 11 U/L (ref 0–53)
AST: 16 U/L (ref 0–37)
Albumin: 4.7 g/dL (ref 3.5–5.2)
Alkaline Phosphatase: 74 U/L (ref 39–117)
BUN: 10 mg/dL (ref 6–23)
CO2: 30 mEq/L (ref 19–32)
Calcium: 9.9 mg/dL (ref 8.4–10.5)
Chloride: 101 mEq/L (ref 96–112)
Creatinine, Ser: 0.85 mg/dL (ref 0.40–1.50)
GFR: 98.86 mL/min (ref >60.00)
Glucose, Bld: 112 mg/dL — ABNORMAL HIGH (ref 70–99)
Potassium: 4.2 mEq/L (ref 3.5–5.1)
Sodium: 138 mEq/L (ref 135–145)
Total Bilirubin: 0.5 mg/dL (ref 0.2–1.2)
Total Protein: 7.5 g/dL (ref 6.0–8.3)

## 2021-04-01 LAB — HEMOGLOBIN A1C: Hgb A1c MFr Bld: 6.4 % (ref 4.6–6.5)

## 2021-04-01 LAB — TESTOSTERONE: Testosterone: 681.2 ng/dL (ref 300.00–890.00)

## 2021-04-01 LAB — URIC ACID: Uric Acid, Serum: 5.8 mg/dL (ref 4.0–7.8)

## 2021-04-01 LAB — D-DIMER, QUANTITATIVE: D-Dimer, Quant: 0.27 mcg/mL FEU (ref <0.50)

## 2021-04-01 LAB — BRAIN NATRIURETIC PEPTIDE: Pro B Natriuretic peptide (BNP): 10 pg/mL (ref 0.0–100.0)

## 2021-04-01 MED ORDER — POTASSIUM CHLORIDE CRYS ER 10 MEQ PO TBCR
10.0000 meq | EXTENDED_RELEASE_TABLET | Freq: Every day | ORAL | 0 refills | Status: DC
Start: 2021-04-01 — End: 2021-04-20

## 2021-04-01 MED ORDER — FUROSEMIDE 20 MG PO TABS: 20.0000 mg | ORAL_TABLET | Freq: Every day | ORAL | 0 refills | Status: DC

## 2021-04-01 MED ORDER — ATORVASTATIN CALCIUM 20 MG PO TABS: 20.0000 mg | ORAL_TABLET | Freq: Every day | ORAL | 1 refills | Status: DC

## 2021-04-01 MED ORDER — KETOCONAZOLE 2 % EX CREA: 1.0000 "application " | TOPICAL_CREAM | Freq: Every day | CUTANEOUS | 0 refills | Status: DC

## 2021-04-01 MED ORDER — ANDRODERM 2 MG/24HR TD PT24: MEDICATED_PATCH | TRANSDERMAL | 3 refills | Status: DC

## 2021-04-01 MED ORDER — POTASSIUM CHLORIDE CRYS ER 10 MEQ PO TBCR: 10.0000 meq | EXTENDED_RELEASE_TABLET | Freq: Two times a day (BID) | ORAL | 0 refills | Status: DC

## 2021-04-01 MED ORDER — ACYCLOVIR 200 MG PO CAPS: 200.0000 mg | ORAL_CAPSULE | Freq: Two times a day (BID) | ORAL | 1 refills | Status: DC

## 2021-04-01 MED ORDER — ANDRODERM 4 MG/24HR TD PT24: MEDICATED_PATCH | TRANSDERMAL | 3 refills | Status: DC

## 2021-04-01 NOTE — Patient Instructions (Signed)
? ?  GO TO THE LAB : Get the blood work   ? ? ?GO TO THE FRONT DESK, PLEASE SCHEDULE YOUR APPOINTMENTS ?Come back for a checkup in 2 weeks ?  ? ?STOP BY THE FIRST FLOOR: Arrange for the ultrasound of both legs ? ?Go to the ER if chest pain, difficulty breathing, increased swelling, heart palpitations ?  ?

## 2021-04-01 NOTE — Progress Notes (Signed)
? ?Subjective:  ? ? Patient ID: Gregory Santos, male    DOB: September 26, 1965, 56 y.o.   MRN: [id number] ? ?DOS:  04/01/2021 ?Type of visit - description: Acute ? ?Developed pain and swelling at the right leg. ?Pain started 4 weeks ago, initially located at the dorsum of the foot. ?The swelling started about 3 days ago and is getting worse, currently up to the knee. ?The pain at the right foot is getting worse, increased with walking. ?He also has some pain proximally, near the hip. ? ? ?Review of Systems ? ?No fever chills ?No recent leg injury. ?The area has not been warm to touch that he can tell. ?No chest pain or difficulty breathing ?No recent airplane trips or prolonged car trip. ? ? ?Past Medical History:  ?Diagnosis Date  ? Anxiety   ? BELLS PALSY 05/25/2007  ? Qualifier: History of  By: Kriste Basque MD, Lonzo Cloud   ? Constipation   ? Headache(784.0)   ? Hemorrhoids   ? History of Bell's palsy   ? History of iron deficiency   ? HSV (herpes simplex virus) infection   ? Hypercholesteremia   ? Obesity   ? OSA (obstructive sleep apnea)   ? Restless leg syndrome   ? Testosterone deficiency   ? Vitamin D deficiency   ? ? ?Past Surgical History:  ?Procedure Laterality Date  ? right inguinal hernia repair as a child    ? ? ?Current Outpatient Medications  ?Medication Instructions  ? acyclovir (ZOVIRAX) 200 MG capsule TAKE 1 CAPSULE BY MOUTH TWICE A DAY  ? acyclovir ointment (ZOVIRAX) 5 % APPLY TOPICALLY EVERY 3  HOURS AS DIRECTED  ? atorvastatin (LIPITOR) 20 MG tablet TAKE 1 TABLET BY MOUTH EVERYDAY AT BEDTIME  ? cholecalciferol (VITAMIN D) 2,000 Units, Oral, Daily  ? clotrimazole-betamethasone (LOTRISONE) cream APPLY TO AFFECTED AREA EVERY DAY AS NEEDED  ? hydrocortisone (ANUSOL-HC) 2.5 % rectal cream Rectal, 2 times daily PRN  ? sildenafil (REVATIO) 60-80 mg, Oral, At bedtime PRN  ? Testosterone (ANDRODERM) 2 MG/24HR PT24 APPLY 1 PATCH ON THE SKIN DAILY ALONG WITH A 4 MG PATCH (TOTAL 6 MG DAILY  ? testosterone (ANDRODERM) 4 MG/24HR  PT24 patch APPLY 1 PATCH DAILY ALONG WITH A 2 MG PATCH FOR A TOTAL OF 6 MG DAILY  ? ? ?   ?Objective:  ? Physical Exam ?BP 136/80 (BP Location: Left Arm, Patient Position: Sitting, Cuff Size: Normal)   Pulse 68   Temp 98.4 ?F (36.9 ?C) (Oral)   Resp 18   Ht 5\' 11"  (1.803 m)   Wt 269 lb 4 oz (122.1 kg)   SpO2 96%   BMI 37.55 kg/m?  ?General:   ?Well developed, NAD, BMI noted. ?HEENT:  ?Normocephalic . Face symmetric, atraumatic ?Lungs:  ?CTA B ?Normal respiratory effort, no intercostal retractions, no accessory muscle use. ?Heart: RRR,  no murmur.  ?Lower extremities: ?Obvious edema, worse on the right.  + Pitting. ?Calves: R is three quarters of an inch larger in circumference. ?Thighs: Not tender to palpation, I measure it and they seem symmetric. ?Good pedal pulses. ?No open wounds at the foot however he has extensive dermatomycosis changes between the toes. ?The right leg from the knee down is a slightly warm but the calfs are not tender to palpation. ?Skin: Not pale. Not jaundice ?Neurologic:  ?alert & oriented X3.  ?Speech normal, gait appropriate for age and unassisted ?Psych--  ?Cognition and judgment appear intact.  ?Cooperative with normal attention span and  concentration.  ?Behavior appropriate. ?No anxious or depressed appearing.  ? ? ?   ?Assessment   ? ? Assessment  ?Hyperglycemia, A1c 5.9  ?hyperlipidemia ?Anxiety ?OSA, RLS----sleep study 2006 w/ RDI=17 & mod snoring w/ signif leg jerks; never tried CPAP, uses  Requip  "prn"; states snoring is gone w/ wt reduction  ?Obesity ?Low testosterone (f/u pcp) -->   known hx atrophic testes likely from mumps orchitis, on HRT --> sx , improved subjectively ?Genital HSV -- acyclovir qd , acyclovir cream ?Vitamin D deficiency 2010 ?H/o Recurrent cough ?H/o hemorrhoids  ?H/o Bell's palsy ?Nail dystrophy: penlac ,creams prn ?+FH CAD Father  ? ?PLAN ?R> L lower extremity edema. ?The patient complained of R foot and leg pain and swelling, on exam the left leg  is also slightly swollen, patient was not aware of it. ?No fever chills, however R leg distal to the knee is slightly warm but not red, not demarcation, no open wounds but + dermatitis between the toes. ?He is on testosterone. ?DDx: DVT, cellulitis, gout, CHF, others.  On clinical grounds, no evidence of PE (no chest pain or difficulty breathing, not tachycardic). ?Plan: CMP, CBC, BNP, uric acid, D-dimer, bilateral lower extremity ultrasound. ?ER if chest pain, palpitations, shortness of breath.  Patient aware. ?Further advised with results. ?Hypogonadism: Check testosterone.  If he has a DVT, will stop HRT ?Hyperglycemia: A1c ?Request Shingrix No. 2.  Provided. ?Addendum: ?Labs normal, specifically uric acid, D-dimer,BNP, WBCs are all okay. ?[place] negative for DVT ?Etiology of asymmetric LE swelling not completely clear, my suspicions for gout or cellulitis are now low given results. ?Plan:  ?Start Lasix 20 mg daily, KCl 10 mg once daily, leg elevation and come back in 2 weeks as recommended. ?Also we will treat fungal dermatitis with ketoconazole. ? ? ?This visit occurred during the SARS-CoV-2 public health emergency.  Safety protocols were in place, including screening questions prior to the visit, additional usage of staff PPE, and extensive cleaning of exam room while observing appropriate contact time as indicated for disinfecting solutions.  ? ?

## 2021-04-01 NOTE — Assessment & Plan Note (Signed)
R> L lower extremity edema. ?The patient complained of R foot and leg pain and swelling, on exam the left leg is also slightly swollen, patient was not aware of it. ?No fever chills, however R leg distal to the knee is slightly warm but not red, not demarcation, no open wounds but + dermatitis between the toes. ?He is on testosterone. ?DDx: DVT, cellulitis, gout, CHF, others.  On clinical grounds, no evidence of PE (no chest pain or difficulty breathing, not tachycardic). ?Plan: CMP, CBC, BNP, uric acid, D-dimer, bilateral lower extremity ultrasound. ?ER if chest pain, palpitations, shortness of breath.  Patient aware. ?Further advised with results. ?Hypogonadism: Check testosterone.  If he has a DVT, will stop HRT ?Hyperglycemia: A1c ?Request Shingrix No. 2.  Provided. ?Addendum: ?Labs normal, specifically uric acid, D-dimer,BNP, WBCs are all okay. ?[place] negative for DVT ?Etiology of asymmetric LE swelling not completely clear, my suspicions for gout or cellulitis are now low given results. ?Plan:  ?Start Lasix 20 mg daily, KCl 10 mg once daily, leg elevation and come back in 2 weeks as recommended. ?Also we will treat fungal dermatitis with ketoconazole. ? ?

## 2021-04-08 ENCOUNTER — Other Ambulatory Visit: Payer: Self-pay | Admitting: Internal Medicine

## 2021-04-16 ENCOUNTER — Other Ambulatory Visit (HOSPITAL_BASED_OUTPATIENT_CLINIC_OR_DEPARTMENT_OTHER): Payer: Self-pay

## 2021-04-16 ENCOUNTER — Ambulatory Visit: Payer: Managed Care, Other (non HMO) | Admitting: Internal Medicine

## 2021-04-16 ENCOUNTER — Encounter: Payer: Self-pay | Admitting: Internal Medicine

## 2021-04-16 VITALS — BP 144/80 | HR 63 | Temp 98.3°F | Resp 16 | Ht 71.0 in | Wt 263.0 lb

## 2021-04-16 DIAGNOSIS — I872 Venous insufficiency (chronic) (peripheral): Secondary | ICD-10-CM

## 2021-04-16 DIAGNOSIS — M7989 Other specified soft tissue disorders: Secondary | ICD-10-CM

## 2021-04-16 LAB — BASIC METABOLIC PANEL
BUN: 14 mg/dL (ref 6–23)
CO2: 29 mEq/L (ref 19–32)
Calcium: 9.5 mg/dL (ref 8.4–10.5)
Chloride: 100 mEq/L (ref 96–112)
Creatinine, Ser: 0.83 mg/dL (ref 0.40–1.50)
GFR: 99.54 mL/min (ref >60.00)
Glucose, Bld: 125 mg/dL — ABNORMAL HIGH (ref 70–99)
Potassium: 4.2 mEq/L (ref 3.5–5.1)
Sodium: 136 mEq/L (ref 135–145)

## 2021-04-16 MED ORDER — ANDRODERM 2 MG/24HR TD PT24
MEDICATED_PATCH | TRANSDERMAL | 3 refills | Status: DC
  Filled 2021-04-16: qty 30, 30d supply, fill #0

## 2021-04-16 MED ORDER — ANDRODERM 4 MG/24HR TD PT24
MEDICATED_PATCH | TRANSDERMAL | 3 refills | Status: DC
  Filled 2021-04-16: qty 30, 30d supply, fill #0

## 2021-04-16 NOTE — Progress Notes (Signed)
? ?Subjective:  ? ? Patient ID: Gregory Santos, male    DOB: August 01, 1965, 56 y.o.   MRN: [id number] ? ?DOS:  04/16/2021 ?Type of visit - description: f/u ? ?Since the last office visit, started Lasix and potassium for leg swelling. ?He thinks swelling is no better, he still has some pain, at this time he points to the R tigh, external side ? ?Denies fever chills ?No chest pain no difficulty breathing ? ?Wt Readings from Last 3 Encounters:  ?04/16/21 263 lb (119.3 kg)  ?04/01/21 269 lb 4 oz (122.1 kg)  ?10/07/20 257 lb (116.6 kg)  ? ? ? ?Review of Systems ?See above  ? ?Past Medical History:  ?Diagnosis Date  ? Anxiety   ? BELLS PALSY 05/25/2007  ? Qualifier: History of  By: Kriste Basque MD, Lonzo Cloud   ? Constipation   ? Headache(784.0)   ? Hemorrhoids   ? History of Bell's palsy   ? History of iron deficiency   ? HSV (herpes simplex virus) infection   ? Hypercholesteremia   ? Obesity   ? OSA (obstructive sleep apnea)   ? Restless leg syndrome   ? Testosterone deficiency   ? Vitamin D deficiency   ? ? ?Past Surgical History:  ?Procedure Laterality Date  ? right inguinal hernia repair as a child    ? ? ?Current Outpatient Medications  ?Medication Instructions  ? acyclovir (ZOVIRAX) 200 mg, Oral, 2 times daily  ? acyclovir ointment (ZOVIRAX) 5 % APPLY TOPICALLY EVERY 3  HOURS AS DIRECTED  ? atorvastatin (LIPITOR) 20 mg, Oral, Daily  ? cholecalciferol (VITAMIN D) 2,000 Units, Oral, Daily  ? clotrimazole-betamethasone (LOTRISONE) cream APPLY TO AFFECTED AREA EVERY DAY AS NEEDED  ? furosemide (LASIX) 20 mg, Oral, Daily  ? hydrocortisone (ANUSOL-HC) 2.5 % rectal cream Rectal, 2 times daily PRN  ? ketoconazole (NIZORAL) 2 % cream 1 application., Topical, Daily  ? potassium chloride (KLOR-CON M) 10 MEQ tablet 10 mEq, Oral, Daily  ? sildenafil (REVATIO) 60-80 mg, Oral, At bedtime PRN  ? Testosterone (ANDRODERM) 2 MG/24HR PT24 APPLY 1 PATCH ON THE SKIN DAILY ALONG WITH A 4 MG PATCH (TOTAL 6 MG DAILY)  ? testosterone (ANDRODERM) 4 MG/24HR  PT24 patch APPLY 1 PATCH DAILY ALONG WITH A 2 MG PATCH FOR A TOTAL OF 6 MG DAILY  ? ? ?   ?Objective:  ? Physical Exam ?BP (!) 144/80 (BP Location: Left Arm, Patient Position: Sitting, Cuff Size: Normal)   Pulse 63   Temp 98.3 ?F (36.8 ?C) (Oral)   Resp 16   Ht 5\' 11"  (1.803 m)   Wt 263 lb (119.3 kg)   SpO2 98%   BMI 36.68 kg/m?  ?General:   ?Well developed, NAD, BMI noted. ?HEENT:  ?Normocephalic . Face symmetric, atraumatic ?Lungs:  ?CTA B ?Normal respiratory effort, no intercostal retractions, no accessory muscle use. ?Heart: RRR,  no murmur.  ?Lower extremities: ?See pictures.  Skin is normal without warmness or redness. ?Calves are measured and  symmetric.  No TTP. ?Near the ankles, the R side is 1 inch longer in circumference. ?Pedal pulses normal ?Skin: Not pale. Not jaundice ?Neurologic:  ?alert & oriented X3.  ?Speech normal, gait appropriate for age and unassisted ?Psych--  ?Cognition and judgment appear intact.  ?Cooperative with normal attention span and concentration.  ?Behavior appropriate. ?No anxious or depressed appearing.  ? ? ?   ?Assessment   ? ?Assessment  ?Hyperglycemia, A1c 5.9  ?hyperlipidemia ?Anxiety ?OSA, RLS----sleep study 2006 w/ RDI=17 &  mod snoring w/ signif leg jerks; never tried CPAP, uses  Requip  "prn"; states snoring is gone w/ wt reduction  ?Obesity ?Low testosterone (f/u pcp) -->   known hx atrophic testes likely from mumps orchitis, on HRT --> sx , improved subjectively ?Genital HSV -- acyclovir qd , acyclovir cream ?Vitamin D deficiency 2010 ?H/o Recurrent cough ?H/o hemorrhoids  ?H/o Bell's palsy ?Nail dystrophy: penlac ,creams prn ?+FH CAD Father  ? ?PLAN ?R> L lower extremity edema. ?See last visit, was seen with lower extremity edema, [place] was negative for DVT.  D-dimer, BNP, WBCs were all okay. ?Objectively, the R calf has decreased in size, nevertheless the right leg continues to be swollen. ?No evidence of CHF, cellulitis or gout. ?Suspect venous insufficiency,  refer to vascular surgery for confirmation and possible treatment if needed. ?Patient in agreement. ?For now continue Lasix and potassium, recheck BMP. ?Low testosterone: Refill sent today.  Addendum, patches not available anymore, will ask our clinical pharmacist to help [place] transition him to gel therapy. ?RTC 4 months ? ? ?This visit occurred during the SARS-CoV-2 public health emergency.  Safety protocols were in place, including screening questions prior to the visit, additional usage of staff PPE, and extensive cleaning of exam room while observing appropriate contact time as indicated for disinfecting solutions.  ? ?

## 2021-04-16 NOTE — Patient Instructions (Addendum)
Continue taking daily furosemide and potassium for swelling. ? ?We are referring you to a specialist (vascular surgery) to see about the swelling. ? ? ? ?GO TO THE LAB : Get the blood work   ? ? ?GO TO THE FRONT DESK, PLEASE SCHEDULE YOUR APPOINTMENTS ?Come back for   a checkup in 4 months ?

## 2021-04-17 ENCOUNTER — Telehealth: Payer: Self-pay | Admitting: *Deleted

## 2021-04-17 ENCOUNTER — Other Ambulatory Visit (HOSPITAL_BASED_OUTPATIENT_CLINIC_OR_DEPARTMENT_OTHER): Payer: Self-pay

## 2021-04-17 ENCOUNTER — Telehealth: Payer: Self-pay

## 2021-04-17 DIAGNOSIS — E291 Testicular hypofunction: Secondary | ICD-10-CM

## 2021-04-17 NOTE — Chronic Care Management (AMB) (Signed)
?  Care Management  ? ?Note ? ?04/17/2021 ?Name: Gregory Santos MRN: [id number] DOB: 02/14/1965 ? ?Gregory Santos is a 56 y.o. year old male who is a primary care patient of Wanda Plump, MD. I reached out to Harlan Stains by phone today offer care coordination services.  ? ?[name] was given information about care management services today including:  ?Care management services include personalized support from designated clinical staff supervised by his physician, including individualized plan of care and coordination with other care providers ?24/7 contact phone numbers for assistance for urgent and routine care needs. ?The patient may stop care management services at any time by phone call to the office staff. ? ?Patient agreed to services and verbal consent obtained.  ? ?Follow up plan: ?Telephone appointment with care management team member scheduled for: 04/22/2021 ? ?Dijon Kohlman, CCMA ?Care Guide, Embedded Care Coordination ?[place]  [facility]  ?Direct Dial: [phone] ? ? ?

## 2021-04-17 NOTE — Telephone Encounter (Signed)
Referral placed to Tammy.  ?

## 2021-04-17 NOTE — Assessment & Plan Note (Addendum)
R> L lower extremity edema. ?See last visit, was seen with lower extremity edema, [place] was negative for DVT.  D-dimer, BNP, WBCs were all okay. ?Objectively, the R calf has decreased in size, nevertheless the right leg continues to be swollen. ?No evidence of CHF, cellulitis or gout. ?Suspect venous insufficiency, refer to vascular surgery for confirmation and possible treatment if needed. ?Patient in agreement. ?For now continue Lasix and potassium, recheck BMP. ?Low testosterone: Refill sent today.  Addendum, patches not available anymore, will ask our clinical pharmacist to help [place] transition him to gel therapy. ?RTC 4 months ?

## 2021-04-17 NOTE — Telephone Encounter (Signed)
Patient called pharmacy and they stated Androderm 2mg  is no longer a manufactured ?  ?Injections or Gel only manufactured ? ? ?Patient prefers gel  ? ? ? ?Patient's wife would like a call back once new script has been sent in  ? ?[phone] ?

## 2021-04-17 NOTE — Telephone Encounter (Signed)
Please arrange a visit with our clinical pharmacist to transition from patch to gel ?

## 2021-04-20 ENCOUNTER — Other Ambulatory Visit (HOSPITAL_BASED_OUTPATIENT_CLINIC_OR_DEPARTMENT_OTHER): Payer: Self-pay

## 2021-04-20 MED ORDER — FUROSEMIDE 20 MG PO TABS
20.0000 mg | ORAL_TABLET | Freq: Every day | ORAL | 4 refills | Status: DC
  Filled 2021-04-20 – 2021-05-05 (×2): qty 30, 30d supply, fill #0

## 2021-04-20 MED ORDER — POTASSIUM CHLORIDE CRYS ER 10 MEQ PO TBCR
10.0000 meq | EXTENDED_RELEASE_TABLET | Freq: Every day | ORAL | 4 refills | Status: DC
  Filled 2021-04-20 – 2021-05-05 (×2): qty 30, 30d supply, fill #0

## 2021-04-20 NOTE — Addendum Note (Signed)
Addended byConrad [place] D on: 04/20/2021 11:27 AM ? ? Modules accepted: Orders ? ?

## 2021-04-22 ENCOUNTER — Telehealth: Payer: Self-pay | Admitting: Pharmacist

## 2021-04-22 ENCOUNTER — Ambulatory Visit: Payer: Managed Care, Other (non HMO) | Admitting: Pharmacist

## 2021-04-22 DIAGNOSIS — E291 Testicular hypofunction: Secondary | ICD-10-CM

## 2021-04-22 DIAGNOSIS — Z79899 Other long term (current) drug therapy: Secondary | ICD-10-CM

## 2021-04-22 MED ORDER — TESTOSTERONE 40.5 MG/2.5GM (1.62%) TD GEL: TRANSDERMAL | 3 refills | Status: DC

## 2021-04-22 NOTE — Chronic Care Management (AMB) (Signed)
?Care Management  ? ?Pharmacy Note ? ?04/22/2021 ?Name: Gregory Santos MRN: [id number] DOB: 02/14/1965 ? ?Summary:  ?Patient was referred to pharmacy services due to difficulty getting Androderm / testosterone 2mg  and 4mg  patches filled at his usual pharmacy. He has used Androderm / testosterone patches with great results for several years (11/2015 until present)  ? ?Lab Results  ?Component Value Date  ? TESTOSTERONE 681.20 04/01/2021  ? ?Previous therapies tried:  ?Androgel 1% 4 pumps per day 02/2011 to 03/2011 ?Axiron 2 applications per day 03/2011 to 04/2011 ?Fortesta 2% gel 4 pumps / 5 pumps per day 04/2011 to 02/2014 ?Testosterone gel 1% 4 pumps per day - 02/2014 to 03/2014 ? ?[facility] to find [facility]) that can get 4mg  Androgel patches. Discussed with patient and his wife and reviewed formulary coverage. Looks like generic Androgel 1.62% is available on covered by his insurance plan. Patient is ok to switch to Androgel. Changed to highest dose of Androgel generic of 81mg  per day due to patient being on highest dose of Androderm of 6mg .  ?Since patient is already on testosterone therapy recommended he can have labs checked at PCP appointment unless symptoms of low T return, in which case I would recommend having labs checked sooner.  ?Sending Rx to provider [facility] ? ?Subjective: ?Gregory Santos is a 56 y.o. year old male who is a primary care patient of , MD. The Care Management team was consulted for assistance with care management and care coordination needs.   ? ?Engaged with patient by telephone for initial visit in response to provider referral for pharmacy case management and/or care coordination services.  ? ?The patient was given information about Care Management services today including:  ?Care Management services includes personalized support from designated clinical staff supervised by the patient's primary care provider,  including individualized plan of care and coordination with other care providers. ?24/7 contact phone numbers for assistance for urgent and routine care needs. ?The patient may stop case management services at any time by phone call to the office staff. ? ?Patient agreed to services and consent obtained. ? ?Assessment:  Review of patient status, including review of consultants reports, laboratory and other test data, was performed as part of comprehensive evaluation and provision of chronic care management services.  ? ?SDOH (Social Determinants of Health) assessments and interventions performed:   ? ?Objective: ? ?Lab Results  ?Component Value Date  ? CREATININE 0.83 04/16/2021  ? CREATININE 0.85 04/01/2021  ? CREATININE 0.87 10/07/2020  ? ? ?Lab Results  ?Component Value Date  ? HGBA1C 6.4 04/01/2021  ? ? ?   ?Component Value Date/Time  ? CHOL 142 10/07/2020 1017  ? TRIG 50.0 10/07/2020 1017  ? HDL 47.50 10/07/2020 1017  ? CHOLHDL 3 10/07/2020 1017  ? VLDL 10.0 10/07/2020 1017  ? LDLCALC 84 10/07/2020 1017  ? LDLDIRECT 157.8 03/02/2006 1017  ? ? ? ? ?Clinical ASCVD: No  ?The 10-year ASCVD risk score (Arnett DK, et al., 2019) is: 7.2% ?  Values used to calculate the score: ?    Age: 32 years ?    Sex: Male ?    Is Non-Hispanic African American: Yes ?    Diabetic: No ?    Tobacco smoker: No ?    Systolic Blood Pressure: 144 mmHg ?    Is BP treated: No ?    HDL Cholesterol: 47.5 mg/dL ?  Total Cholesterol: 142 mg/dL   ? ? ? ?BP Readings from Last 3 Encounters:  ?04/16/21 (!) 144/80  ?04/01/21 136/80  ?10/07/20 (!) 148/90  ? ? ?Care Plan ? ?Allergies  ?Allergen Reactions  ? Codeine Phosphate   ?  REACTION: nausea and vomiting  ? ? ?Medications Reviewed Today   ? ? Reviewed by Conrad Gallatin, CMA (Certified Medical Assistant) on 04/16/21 at 1007  Med List Status: <None>  ? ?Medication Order Taking? Sig Documenting Provider Last Dose Status Informant  ?acyclovir (ZOVIRAX) 200 MG capsule [id number]  Take 1 capsule (200  mg total) by mouth 2 (two) times daily. Wanda Plump, MD  Active   ?acyclovir ointment (ZOVIRAX) 5 % [id number]  APPLY TOPICALLY EVERY 3  HOURS AS DIRECTED Wanda Plump, MD  Active   ?atorvastatin (LIPITOR) 20 MG tablet [id number]  Take 1 tablet (20 mg total) by mouth daily. Wanda Plump, MD  Active   ?cholecalciferol (VITAMIN D) 1000 units tablet [id number]  Take 2,000 Units by mouth daily. [provider]  Active   ?clotrimazole-betamethasone (LOTRISONE) cream [id number]  APPLY TO AFFECTED AREA EVERY DAY AS NEEDED Wanda Plump, MD  Active   ?furosemide (LASIX) 20 MG tablet [id number]  Take 1 tablet (20 mg total) by mouth daily. Wanda Plump, MD  Active   ?hydrocortisone (ANUSOL-HC) 2.5 % rectal cream [id number]  Place rectally 2 (two) times daily as needed for hemorrhoids or anal itching. Wanda Plump, MD  Active   ?ketoconazole (NIZORAL) 2 % cream [id number]  Apply 1 application. topically daily. Wanda Plump, MD  Active   ?potassium chloride (KLOR-CON M) 10 MEQ tablet [id number]  Take 1 tablet (10 mEq total) by mouth daily. Wanda Plump, MD  Active   ?sildenafil (REVATIO) 20 MG tablet [id number]  Take 3-4 tablets (60-80 mg total) by mouth at bedtime as needed. Wanda Plump, MD  Active   ?Testosterone (ANDRODERM) 2 MG/24HR PT24 [id number]  APPLY 1 PATCH ON THE SKIN DAILY ALONG WITH A 4 MG PATCH (TOTAL 6 MG DAILY) Wanda Plump, MD  Active   ?testosterone (ANDRODERM) 4 MG/24HR PT24 patch [id number]  APPLY 1 PATCH DAILY ALONG WITH A 2 MG PATCH FOR A TOTAL OF 6 MG DAILY Wanda Plump, MD  Active   ? ?  ?  ? ?  ? ? ?Patient Active Problem List  ? Diagnosis Date Noted  ? Hyperglycemia 09/20/2017  ? PCP NOTES >>>>> 11/20/2014  ? Annual physical exam 10/22/2013  ? Obesity (BMI 30-39.9) 10/17/2012  ? Recurrent non-productive cough 03/15/2012  ? GERD (gastroesophageal reflux disease) 03/15/2012  ? Laryngopharyngeal reflux disease 03/15/2012  ? VITAMIN D DEFICIENCY 02/21/2009  ? Hypogonadism in male 01/29/2008  ? HEMORRHOIDS 01/29/2008   ? Herpes simplex virus (HSV) infection 05/25/2007  ? OBSTRUCTIVE SLEEP APNEA 05/25/2007  ? HYPERCHOLESTEROLEMIA 05/24/2007  ? Anxiety state 05/24/2007  ? RESTLESS LEG SYNDROME 05/24/2007  ? Constipation 05/24/2007  ? HEADACHE 05/24/2007  ? ? ? ?Medication Assistance:  None required.  Patient affirms current coverage meets needs. ? ? ?Plan: Telephone follow up appointment with care management team member scheduled for:  2 to 4 weeks to check to see if he is tolerating testosterone product switch ? ?Henrene Pastor, PharmD ?Clinical Pharmacist ?[facility] ?[facility] ? ? ? ?

## 2021-04-22 NOTE — Telephone Encounter (Incomplete Revision)
Prescription printed and sent. ?Check a testosterone level in 2 months ?

## 2021-04-22 NOTE — Telephone Encounter (Signed)
Unable to find pharmacy that can get 4mg  Androgel patches. Discussed with patient and his wife. Ok to switch to Androgel. Changed to highest dose of Androgel generic of 81mg  per day due to patient being on highest dose of Androderm of 6mg .  ?Since patient is already on testosterone therapy recommended he can have labs checked at PCP appointment unless symptoms of low T return, in which case I would recommend having labs checked sooner.  ?Sending Rx to provider [facility].  ?

## 2021-04-22 NOTE — Telephone Encounter (Addendum)
Prescription printed and sent. ?Check a testosterone level in 2 months ?

## 2021-04-23 ENCOUNTER — Other Ambulatory Visit: Payer: Self-pay | Admitting: Internal Medicine

## 2021-04-28 ENCOUNTER — Telehealth: Payer: Self-pay | Admitting: Internal Medicine

## 2021-04-28 NOTE — Telephone Encounter (Signed)
PA denied. Pt's plan does not cover age-related hypogonadism, they only cover for organic cause testosterone deficiency (example: injury, tumor, infection, or genetic defects) ?

## 2021-04-28 NOTE — Telephone Encounter (Signed)
Pharmacy stated they need  prior auth and instead received a new rx for the testosterone gel. Please advise. ? ? [id number] [address]  ? [place], [place] [address]  ?Phone:  [phone]  Fax:  [phone]  ?

## 2021-04-28 NOTE — Telephone Encounter (Signed)
PA initiated via Covermymeds; KEY: [id number]. Awaiting determination.  ?

## 2021-04-28 NOTE — Telephone Encounter (Signed)
The patient has a history of testicular from  orchitis. ?Please arrange a peer to peer review.  If unable to do, refer to Endo ?

## 2021-04-29 NOTE — Telephone Encounter (Signed)
Spoke with [facility]. They states that testosterone that was processed was not approved for age related testosterone. Provided agents that are approved Natesto, Androderm (patches are not currently available) or and generic testosterone solution or gel as long as it is NOT the generic for Testim or Vogelxo. Also states that we could try updating diagnosis code but she was not able to provide which diagnosis codes are covered.  ?Currently using E29.1 which is code for hypogondanism. Dr Drue Novel mentioned patient has history of testicular orchitis. Will reach out to Dr Drue Novel to updated diagnosis.  ? ?[facility] to make sure they were using correct NDC number for generic testosterone gel. Unfortunately their computer system is down and unable to provide any information. Will try again later.  ?

## 2021-04-29 NOTE — Telephone Encounter (Signed)
Not sure if this will help

## 2021-04-30 ENCOUNTER — Other Ambulatory Visit: Payer: Self-pay | Admitting: Internal Medicine

## 2021-04-30 NOTE — Telephone Encounter (Signed)
Spoke to representative with [employer] and it looks like we needed to check box that patient does NOT have age related testosterone deficiency. Patient has primary hypogonadism with history of orchitis.  ?Resent prior authorization - awaiting decision. ?

## 2021-04-30 NOTE — Telephone Encounter (Signed)
Thx

## 2021-05-02 ENCOUNTER — Other Ambulatory Visit: Payer: Self-pay | Admitting: Internal Medicine

## 2021-05-04 NOTE — Telephone Encounter (Signed)
PA approved. Effective 04/30/21 to 04/30/24.  ?

## 2021-05-05 ENCOUNTER — Other Ambulatory Visit (HOSPITAL_BASED_OUTPATIENT_CLINIC_OR_DEPARTMENT_OTHER): Payer: Self-pay

## 2021-05-12 ENCOUNTER — Other Ambulatory Visit (HOSPITAL_BASED_OUTPATIENT_CLINIC_OR_DEPARTMENT_OTHER): Payer: Self-pay

## 2021-05-13 ENCOUNTER — Other Ambulatory Visit: Payer: Self-pay | Admitting: Internal Medicine

## 2021-05-26 ENCOUNTER — Ambulatory Visit: Payer: Managed Care, Other (non HMO) | Admitting: Pharmacist

## 2021-05-26 DIAGNOSIS — M7989 Other specified soft tissue disorders: Secondary | ICD-10-CM

## 2021-05-26 DIAGNOSIS — E291 Testicular hypofunction: Secondary | ICD-10-CM

## 2021-05-26 DIAGNOSIS — Z79899 Other long term (current) drug therapy: Secondary | ICD-10-CM

## 2021-05-26 MED ORDER — FUROSEMIDE 20 MG PO TABS: 20.0000 mg | ORAL_TABLET | Freq: Every day | ORAL | 2 refills | Status: DC

## 2021-05-26 NOTE — Chronic Care Management (AMB) (Signed)
?Care Management  ? ?Pharmacy Note ? ?05/26/2021 ?Name: Gregory Santos MRN: [id number] DOB: 01/13/1966 ? ?Summary:  ?Spoke with patient's wife today by phone. Patient was referred to pharmacy services due to difficulty getting Androderm / testosterone 2mg  and 4mg  patches filled at his usual pharmacy. He was changed to testosterone gel 40.5mg /2.5 grams - applies 2 packets or 81mg  daily. Patient has tolerated switch from testosterone patches to gel. Will have testosterone recheck beginning of June after has been on new product for 2 months . ? ?Lab Results  ?Component Value Date  ? TESTOSTERONE 681.20 04/01/2021  ? ?Previous therapies tried:  ?Androgel 1% 4 pumps per day 02/2011 to 03/2011 ?Axiron 2 applications per day 03/2011 to 04/2011 ?Fortesta 2% gel 4 pumps / 5 pumps per day 04/2011 to 02/2014 ?Testosterone gel 1% 4 pumps per day - 02/2014 to 03/2014 ? ?Patient also was having edema - prescribe furosemide 20mg  daily and potassium supplement. He has appointment for evaluation with vascular surgeon for work up 06/24/2021. He has not been taking furosemide because [facility]. [facility]. Endorsed that he has potassium.  ? ?Patient's last documented ambulatory blood pressure was slightly elevated at 144/80. ? ?BP Readings from Last 3 Encounters:  ?04/16/21 (!) 144/80  ?04/01/21 136/80  ?10/07/20 (!) 148/90  ? ?Current antihypertensives: furosemide 20mg  daily - has not filled since 04/01/2021 ? ?Patient does not have an automated upper arm home BP machine. ? ?Subjective: ?Gregory Santos is a [age] y.o. year old male who is a primary care patient of Wanda PlumpPaz, Jose E, MD. The Care Management team was consulted for assistance with care management and care coordination needs.   ? ?Engaged with patient by telephone for follow up visit in response to provider referral for pharmacy case management and/or care coordination services.   ? ?The patient was given information about Care Management services today including:  ?Care Management services includes personalized support from designated clinical staff supervised by the patient's primary care provider, including individualized plan of care and coordination with other care providers. ?24/7 contact phone numbers for assistance for urgent and routine care needs. ?The patient may stop case management services at any time by phone call to the office staff. ? ?Patient agreed to services and consent obtained. ? ?Assessment:  Review of patient status, including review of consultants reports, laboratory and other test data, was performed as part of comprehensive evaluation and provision of chronic care management services.  ? ?SDOH (Social Determinants of Health) assessments and interventions performed:   ? ?Objective: ? ?Lab Results  ?Component Value Date  ? CREATININE 0.83 04/16/2021  ? CREATININE 0.85 04/01/2021  ? CREATININE 0.87 10/07/2020  ? ? ?Lab Results  ?Component Value Date  ? HGBA1C 6.4 04/01/2021  ? ? ?   ?Component Value Date/Time  ? CHOL 142 10/07/2020 1017  ? TRIG 50.0 10/07/2020 1017  ? HDL 47.50 10/07/2020 1017  ? CHOLHDL 3 10/07/2020 1017  ? VLDL 10.0 10/07/2020 1017  ? LDLCALC 84 10/07/2020 1017  ? LDLDIRECT 157.8 03/02/2006 1017  ? ? ? ? ?Clinical ASCVD: No  ?The 10-year ASCVD risk score (Arnett DK, et al., 2019) is: 7.2% ?  Values used to calculate the score: ?    Age: 12 years ?    Sex: Male ?    Is Non-Hispanic African American: Yes ?    Diabetic: No ?  Tobacco smoker: No ?    Systolic Blood Pressure: 144 mmHg ?    Is BP treated: No ?    HDL Cholesterol: 47.5 mg/dL ?    Total Cholesterol: 142 mg/dL   ? ? ? ?BP Readings from Last 3 Encounters:  ?04/16/21 (!) 144/80  ?04/01/21 136/80  ?10/07/20 (!) 148/90  ? ? ?Care Plan ? ?Allergies  ?Allergen Reactions  ? Codeine Phosphate Nausea And Vomiting  ? ? ?Medications Reviewed Today   ? ? Reviewed by Henrene Pastor, RPH-CPP  (Pharmacist) on 05/26/21 at 0909  Med List Status: <None>  ? ?Medication Order Taking? Sig Documenting Provider Last Dose Status Informant  ?acyclovir (ZOVIRAX) 200 MG capsule [id number] Yes Take 1 capsule (200 mg total) by mouth 2 (two) times daily. Wanda Plump, MD Taking Active   ?acyclovir ointment (ZOVIRAX) 5 % [id number] Yes APPLY TOPICALLY EVERY 3  HOURS AS DIRECTED Wanda Plump, MD Taking Active   ?atorvastatin (LIPITOR) 20 MG tablet [id number] Yes Take 1 tablet (20 mg total) by mouth daily. Wanda Plump, MD Taking Active   ?cholecalciferol (VITAMIN D) 1000 units tablet [id number] Yes Take 2,000 Units by mouth daily. [provider] Taking Active   ?clotrimazole-betamethasone (LOTRISONE) cream [id number] Yes APPLY TO AFFECTED AREA EVERY DAY AS NEEDED Wanda Plump, MD Taking Active   ?furosemide (LASIX) 20 MG tablet [id number] No Take 1 tablet (20 mg total) by mouth daily.  ?Patient not taking: Reported on 05/26/2021  ? Wanda Plump, MD Not Taking Active   ?hydrocortisone (ANUSOL-HC) 2.5 % rectal cream [id number] Yes PLACE RECTALLY 2 TIMES DAILY AS NEEDED FOR HEMORRHOIDS OR ANAL ITCHING Paz, Nolon Rod, MD Taking Active   ?ketoconazole (NIZORAL) 2 % cream [id number] Yes Apply 1 application. topically daily. Wanda Plump, MD Taking Active   ?KLOR-CON M10 10 MEQ tablet [id number] Yes TAKE 1 TABLET BY MOUTH EVERY DAY Wanda Plump, MD Taking Active   ?sildenafil (REVATIO) 20 MG tablet [id number] Yes Take 3-4 tablets (60-80 mg total) by mouth at bedtime as needed. Wanda Plump, MD Taking Active   ?Testosterone 40.5 MG/2.5GM (1.62%) GEL [id number] Yes Apply 2 packets to upper arms each morning / after showering. Wash hands after application and wear clothing over application site.  ?Do not apply to abdomen.Do not swim or wash for two hours after applying Wanda Plump, MD Taking Active   ? ?  ?  ? ?  ? ? ?Patient Active Problem List  ? Diagnosis Date Noted  ? Hyperglycemia 09/20/2017  ? PCP NOTES >>>>> 11/20/2014  ? Annual physical  exam 10/22/2013  ? Obesity (BMI 30-39.9) 10/17/2012  ? Recurrent non-productive cough 03/15/2012  ? GERD (gastroesophageal reflux disease) 03/15/2012  ? Laryngopharyngeal reflux disease 03/15/2012  ? VITAMIN D DEFICIENCY 02/21/2009  ? Hypogonadism in male 01/29/2008  ? HEMORRHOIDS 01/29/2008  ? Herpes simplex virus (HSV) infection 05/25/2007  ? OBSTRUCTIVE SLEEP APNEA 05/25/2007  ? HYPERCHOLESTEROLEMIA 05/24/2007  ? Anxiety state 05/24/2007  ? RESTLESS LEG SYNDROME 05/24/2007  ? Constipation 05/24/2007  ? HEADACHE 05/24/2007  ? ? ? ?Medication Assistance:  None required.  Patient affirms current coverage meets needs. ? ? ?Plan: Telephone follow up appointment with care management team member scheduled for:  check back after vascular appointment regarding blood pressure. ?[facility] 20[facility]  daily  #30 with 2 RF. Recheck testosterone in 1 month. ?Discussed blood pressure goals.  ? ? , PharmD ?Clinical Pharmacist ?[facility] ?[facility] ? ?

## 2021-06-24 ENCOUNTER — Encounter (HOSPITAL_COMMUNITY): Payer: Managed Care, Other (non HMO)

## 2021-06-27 ENCOUNTER — Other Ambulatory Visit: Payer: Self-pay | Admitting: Internal Medicine

## 2021-06-30 ENCOUNTER — Other Ambulatory Visit: Payer: Self-pay | Admitting: *Deleted

## 2021-06-30 DIAGNOSIS — M79606 Pain in leg, unspecified: Secondary | ICD-10-CM

## 2021-07-06 NOTE — Progress Notes (Signed)
[facility] [place]  ASSESSMENT / PLAN: QUAVIS BANDUCCI is a 56 y.o. male with chronic venous insufficiency of bilateral lower extremities causing swelling and pain.  I am suspicious that his lower extremity pain may be musculoskeletal in origin, as it is not typical discomfort described by patients with CVI. Venous duplex shows no evidence of superficial venous reflux. Recommend weight loss, compression, and elevation for symptomatic relief. Follow up with me as needed.  CHIEF COMPLAINT: Swollen, painful legs  HISTORY OF PRESENT ILLNESS: ABDULKADIR MCAULAY is a 56 y.o. male referred to clinic for evaluation of chronic venous insufficiency of bilateral lower extremities.  The patient reports a several month history of significant swelling in his lower extremities most notably about the ankles and calves.  Associated with the swelling, he has noticed discomfort radiating down his legs from his buttock bilaterally.  The pain in his legs is alleviated by positional changes, especially transitioning from a standing to seated position.  The pain bothers him in his legs whether he is standing or walking for extended periods of time.  Stopping walking does not alleviate his symptoms.  He has tried over-the-counter compression, but has not tried medical grade compression yet.  Past Medical History:  Diagnosis Date   Anxiety    BELLS PALSY 05/25/2007   Qualifier: History of  By: Lenna Gilford MD, Deborra Medina    Constipation    Headache(784.0)    Hemorrhoids    History of Bell's palsy    History of iron deficiency    HSV (herpes simplex virus) infection    Hypercholesteremia    Obesity    OSA (obstructive sleep apnea)    Restless leg syndrome    Testosterone deficiency    Vitamin D deficiency     Past Surgical History:  Procedure Laterality Date   right inguinal hernia repair as a child      Family History  Problem Relation Age of Onset   CAD Father        at a young age    Diabetes Mother        M , [place]   Stroke Other        GF (late onset)   Colon cancer Neg Hx    Prostate cancer Neg Hx     Social History   Socioeconomic History   Marital status: Married    Spouse name: Not on file   Number of children: 0   Years of education: Not on file   Highest education level: Not on file  Occupational History   Occupation: city transfer , record mngmt   Tobacco Use   Smoking status: Never   Smokeless tobacco: Never  Substance and Sexual Activity   Alcohol use: Yes    Alcohol/week: 0.0 standard drinks of alcohol    Comment: rare wine   Drug use: No   Sexual activity: Not on file  Other Topics Concern   Not on file  Social History Narrative   Lives w/ wife   Social Determinants of Health   Financial Resource Strain: Not on file  Food Insecurity: Not on file  Transportation Needs: Not on file  Physical Activity: Not on file  Stress: Not on file  Social Connections: Not on file  Intimate Partner Violence: Not on file    Allergies  Allergen Reactions   Codeine Phosphate Nausea And Vomiting    Current Outpatient Medications  Medication Sig Dispense Refill   acyclovir (ZOVIRAX) 200 MG capsule  Take 1 capsule (200 mg total) by mouth 2 (two) times daily. 180 capsule 1   acyclovir ointment (ZOVIRAX) 5 % APPLY TOPICALLY EVERY 3  HOURS AS DIRECTED 90 g 2   atorvastatin (LIPITOR) 20 MG tablet Take 1 tablet (20 mg total) by mouth daily. 90 tablet 1   cholecalciferol (VITAMIN D) 1000 units tablet Take 2,000 Units by mouth daily.     clotrimazole-betamethasone (LOTRISONE) cream APPLY TO AFFECTED AREA EVERY DAY AS NEEDED 45 g 0   furosemide (LASIX) 20 MG tablet TAKE 1 TABLET BY MOUTH EVERY DAY 90 tablet 0   hydrocortisone (ANUSOL-HC) 2.5 % rectal cream PLACE RECTALLY 2 TIMES DAILY AS NEEDED FOR HEMORRHOIDS OR ANAL ITCHING 30 g 2   ketoconazole (NIZORAL) 2 % cream Apply 1 application. topically daily. 30 g 0   potassium chloride (KLOR-CON M10) 10 MEQ  tablet TAKE 1 TABLET BY MOUTH EVERY DAY 90 tablet 0   sildenafil (REVATIO) 20 MG tablet Take 3-4 tablets (60-80 mg total) by mouth at bedtime as needed. 40 tablet 2   Testosterone 40.5 MG/2.5GM (1.62%) GEL Apply 2 packets to upper arms each morning / after showering. Wash hands after application and wear clothing over application site.  Do not apply to abdomen.Do not swim or wash for two hours after applying 60 packet 3   No current facility-administered medications for this visit.    PHYSICAL EXAM Vitals:   07/07/21 0853  BP: (!) 151/100  Pulse: 65  Resp: 20  Temp: 98.8 F (37.1 C)  SpO2: 97%  Weight: 271 lb 11.2 oz (123.2 kg)  Height: 5\' 11"  (1.803 m)    Constitutional: Well-appearing middle-aged gentleman in no acute distress Cardiac: Regular rate and rhythm Respiratory: Unlabored breathing Extremity: 2+ dorsalis pedis pulses bilaterally.  2+ pitting edema of the lower extremities from ankle to knee bilaterally.  Right worse than left.  Equivocal Stemmer sign.    PERTINENT LABORATORY AND RADIOLOGIC DATA  Most recent CBC    Latest Ref Rng & Units 04/01/2021    9:25 AM 05/28/2020   10:04 AM 08/21/2019    9:31 AM  CBC  WBC 4.0 - 10.5 K/uL 8.6  8.0  8.0   Hemoglobin 13.0 - 17.0 g/dL 13.4  14.2  12.9   Hematocrit 39.0 - 52.0 % 41.3  42.2  37.9   Platelets 150.0 - 400.0 K/uL 350.0  356.0  329.0      Most recent CMP    Latest Ref Rng & Units 04/16/2021   11:20 AM 04/01/2021    9:25 AM 10/07/2020   10:17 AM  CMP  Glucose 70 - 99 mg/dL 125  112  113   BUN 6 - 23 mg/dL 14  10  12    Creatinine 0.40 - 1.50 mg/dL 0.83  0.85  0.87   Sodium 135 - 145 mEq/L 136  138  137   Potassium 3.5 - 5.1 mEq/L 4.2  4.2  4.5   Chloride 96 - 112 mEq/L 100  101  101   CO2 19 - 32 mEq/L 29  30  29    Calcium 8.4 - 10.5 mg/dL 9.5  9.9  9.9   Total Protein 6.0 - 8.3 g/dL  7.5  7.5   Total Bilirubin 0.2 - 1.2 mg/dL  0.5  0.8   Alkaline Phos 39 - 117 U/L  74  65   AST 0 - 37 U/L  16  15   ALT 0 - 53  U/L  11  12  Renal function CrCl cannot be calculated (Patient's most recent lab result is older than the maximum 21 days allowed.).  Hgb A1c MFr Bld (%)  Date Value  04/01/2021 6.4    LDL Cholesterol  Date Value Ref Range Status  10/07/2020 84 0 - 99 mg/dL Final   Direct LDL  Date Value Ref Range Status  03/02/2006 157.8 mg/dL Final    Comment:    See lab report for associated comment(s)    Venous reflux study Right:  - No evidence of deep vein thrombosis seen in the right lower extremity,  from the common femoral through the popliteal veins.  - No evidence of superficial venous thrombosis in the right lower  extremity.  - There is no evidence of venous reflux seen in the right lower extremity.  - No evidence of superficial venous reflux seen in the right greater  saphenous vein.   Yevonne Aline. Stanford Breed, MD [facility] Phone Number: [phone] 07/07/2021 12:19 PM  Total time spent on preparing this encounter including chart review, data review, collecting history, examining the patient, coordinating care for this new patient, 60 minutes.  Portions of this report may have been transcribed using voice recognition software.  Every effort has been made to ensure accuracy; however, inadvertent computerized transcription errors may still be present.

## 2021-07-07 ENCOUNTER — Ambulatory Visit: Payer: Managed Care, Other (non HMO) | Admitting: Vascular Surgery

## 2021-07-07 ENCOUNTER — Ambulatory Visit (HOSPITAL_COMMUNITY)
Admission: RE | Admit: 2021-07-07 | Discharge: 2021-07-07 | Disposition: A | Discharge status: Home / Self Care | Payer: Managed Care, Other (non HMO) | Source: Ambulatory Visit | Attending: Vascular Surgery | Admitting: Vascular Surgery

## 2021-07-07 ENCOUNTER — Encounter: Payer: Self-pay | Admitting: Vascular Surgery

## 2021-07-07 VITALS — BP 151/100 | HR 65 | Temp 98.8°F | Resp 20 | Ht 71.0 in | Wt 271.7 lb

## 2021-07-07 DIAGNOSIS — M79606 Pain in leg, unspecified: Secondary | ICD-10-CM | POA: Diagnosis present

## 2021-07-07 DIAGNOSIS — I872 Venous insufficiency (chronic) (peripheral): Secondary | ICD-10-CM | POA: Diagnosis not present

## 2021-07-07 DIAGNOSIS — M7989 Other specified soft tissue disorders: Secondary | ICD-10-CM

## 2021-07-07 DIAGNOSIS — M79604 Pain in right leg: Secondary | ICD-10-CM

## 2021-07-25 ENCOUNTER — Other Ambulatory Visit: Payer: Self-pay | Admitting: Internal Medicine

## 2021-08-19 ENCOUNTER — Ambulatory Visit: Payer: Managed Care, Other (non HMO) | Admitting: Internal Medicine

## 2021-08-21 ENCOUNTER — Encounter: Payer: Self-pay | Admitting: Internal Medicine

## 2021-08-21 ENCOUNTER — Ambulatory Visit: Payer: Managed Care, Other (non HMO) | Admitting: Internal Medicine

## 2021-08-21 VITALS — BP 126/80 | HR 69 | Temp 98.1°F | Resp 18 | Ht 71.0 in | Wt 271.5 lb

## 2021-08-21 DIAGNOSIS — G2581 Restless legs syndrome: Secondary | ICD-10-CM

## 2021-08-21 DIAGNOSIS — E291 Testicular hypofunction: Secondary | ICD-10-CM

## 2021-08-21 DIAGNOSIS — R739 Hyperglycemia, unspecified: Secondary | ICD-10-CM | POA: Diagnosis not present

## 2021-08-21 DIAGNOSIS — I872 Venous insufficiency (chronic) (peripheral): Secondary | ICD-10-CM

## 2021-08-21 LAB — PSA: PSA: 0.51 ng/mL (ref 0.10–4.00)

## 2021-08-21 LAB — HEMOGLOBIN A1C: Hgb A1c MFr Bld: 6.9 % — ABNORMAL HIGH (ref 4.6–6.5)

## 2021-08-21 LAB — TESTOSTERONE: Testosterone: 565.92 ng/dL (ref 300.00–890.00)

## 2021-08-21 MED ORDER — ROPINIROLE HCL 0.25 MG PO TABS: 0.2500 mg | ORAL_TABLET | Freq: Three times a day (TID) | ORAL | 0 refills | Status: DC

## 2021-08-21 NOTE — Progress Notes (Unsigned)
Subjective:    Patient ID: Gregory Santos, male    DOB: August 05, 1965, 56 y.o.   MRN: [id number]  DOS:  08/21/2021 Type of visit - description: Follow-up  Today we discussed several issues.  Lower extremity edema: saw vascular surgery.  Note reviewed.  He continue with lower extremity pain, it is constant, bilateral, encompasses the whole legs but is worse from the knee down.  Not necessarily worse with walking.  No back pain.  Our clinical pharmacist alerted me about his BP still baseline elevated.  No ambulatory BPs.  Wt Readings from Last 3 Encounters:  08/21/21 271 lb 8 oz (123.2 kg)  07/07/21 271 lb 11.2 oz (123.2 kg)  04/16/21 263 lb (119.3 kg)   BP Readings from Last 3 Encounters:  08/21/21 126/80  07/07/21 (!) 151/100  04/16/21 (!) 144/80     Review of Systems See above   Past Medical History:  Diagnosis Date   Anxiety    BELLS PALSY 05/25/2007   Qualifier: History of  By: Kriste Basque MD, Lonzo Cloud    Constipation    Headache(784.0)    Hemorrhoids    History of Bell's palsy    History of iron deficiency    HSV (herpes simplex virus) infection    Hypercholesteremia    Obesity    OSA (obstructive sleep apnea)    Restless leg syndrome    Testosterone deficiency    Vitamin D deficiency     Past Surgical History:  Procedure Laterality Date   right inguinal hernia repair as a child      Current Outpatient Medications  Medication Instructions   acyclovir (ZOVIRAX) 200 mg, Oral, 2 times daily   acyclovir ointment (ZOVIRAX) 5 % APPLY TOPICALLY EVERY 3  HOURS AS DIRECTED   atorvastatin (LIPITOR) 20 mg, Oral, Daily   cholecalciferol (VITAMIN D) 2,000 Units, Oral, Daily   clotrimazole-betamethasone (LOTRISONE) cream APPLY TO AFFECTED AREA EVERY DAY AS NEEDED   furosemide (LASIX) 20 MG tablet TAKE 1 TABLET BY MOUTH EVERY DAY   hydrocortisone (ANUSOL-HC) 2.5 % rectal cream PLACE RECTALLY 2 TIMES DAILY AS NEEDED FOR HEMORRHOIDS OR ANAL ITCHING   ketoconazole (NIZORAL) 2 %  cream 1 application , Topical, Daily   potassium chloride (KLOR-CON M10) 10 MEQ tablet TAKE 1 TABLET BY MOUTH EVERY DAY   rOPINIRole (REQUIP) 0.25 mg, Oral, 3 times daily   sildenafil (REVATIO) 20 MG tablet TAKE THREE TO FOUR TABLETS BY MOUTH EVERY NIGHT AT BEDTIME AS NEEDED   Testosterone 40.5 MG/2.5GM (1.62%) GEL Apply 2 packets to upper arms each morning / after showering. Wash hands after application and wear clothing over application site. <BR>Do not apply to abdomen.Do not swim or wash for two hours after applying       Objective:   Physical Exam BP 126/80   Pulse 69   Temp 98.1 F (36.7 C) (Oral)   Resp 18   Ht 5\' 11"  (1.803 m)   Wt 271 lb 8 oz (123.2 kg)   SpO2 96%   BMI 37.87 kg/m  General:   Well developed, NAD, BMI noted. HEENT:  Normocephalic . Face symmetric, atraumatic Lungs:  CTA B Normal respiratory effort, no intercostal retractions, no accessory muscle use. Heart: RRR,  no murmur.  Lower extremities: Uses compression stockings, edema decreased compared to the last visit, known on the left, trace on the right Skin: Not pale. Not jaundice Neurologic:  alert & oriented X3.  Speech normal, gait appropriate for age and unassisted Psych--  Cognition and judgment appear intact.  Cooperative with normal attention span and concentration.  Behavior appropriate. No anxious or depressed appearing.      Assessment     Assessment  Hyperglycemia, A1c 5.9  hyperlipidemia Anxiety OSA, RLS sleep study 2006 w/ RDI=17 & mod snoring w/ signif leg jerks; never tried CPAP, uses  Requip  "prn"; states snoring is gone w/ wt reduction  Obesity Low testosterone (f/u pcp) -->   known hx atrophic testes likely from mumps orchitis, on HRT --> sx , improved subjectively Genital HSV -- acyclovir qd , acyclovir cream Vitamin D deficiency 2010 Venous  insufficiency bilaterally (saw vascular 07/07/2021) Dermatitis: Uses as needed Lotrisone-needs to at the groins H/o Recurrent  cough H/o hemorrhoids  H/o Bell's palsy Nail dystrophy: penlac ,creams prn +FH CAD Father     PLAN Hyperglycemia: Check A1c Elevated BP: BP has elevated a couple of times, today's normal, recommend to check at home. Venous insufficiency, B:  See last visit, he complained of edema, saw the vascular specialist, was Rx weight loss, compression stocking, elevation of the legs. At the time he was complaining of pain in the legs, felt to the MSK issue. Swelling is much better today, recommend to continue using the compression stockings. Pain, lower extremities: As described above, does not have PVD, does have some venous insufficiency but is well treated. Suspect pain is RLS related.  Plan: Requip 0.25 mg daily, twice daily and 3 times daily.  See AVS. RLS: See above Low testosterone: On testosterone gel, checking testosterone and a PSA RTC 4 months    A1c total testosterone PSA ==== R> L lower extremity edema. See last visit, was seen with lower extremity edema, [place] was negative for DVT.  D-dimer, BNP, WBCs were all okay. Objectively, the R calf has decreased in size, nevertheless the right leg continues to be swollen. No evidence of CHF, cellulitis or gout. Suspect venous insufficiency, refer to vascular surgery for confirmation and possible treatment if needed. Patient in agreement. For now continue Lasix and potassium, recheck BMP. Low testosterone: Refill sent today.  Addendum, patches not available anymore, will ask our clinical pharmacist to help [place] transition him to gel therapy. RTC 4 months

## 2021-08-21 NOTE — Patient Instructions (Addendum)
  Continue using the compression stockings  For the leg pain recommend  ROPIRINOLE 0.25 mg: 1 tablet at bedtime for 1 week Then 1 tablet twice daily for 1 week 1 tablet 3 times a day  Check the  blood pressure regularly BP GOAL is between 110/65 and  135/85. If it is consistently higher or lower, let me know    GO TO THE LAB : Get the blood work     GO TO THE FRONT DESK, PLEASE SCHEDULE YOUR APPOINTMENTS Come back for a checkup in 4 months  Recommend to proceed with covid booster (bivalent) at your pharmacy.  Flu shot this fall.

## 2021-08-23 DIAGNOSIS — I872 Venous insufficiency (chronic) (peripheral): Secondary | ICD-10-CM | POA: Insufficient documentation

## 2021-08-23 NOTE — Assessment & Plan Note (Signed)
Hyperglycemia: Check A1c Elevated BP: BP has elevated a couple of times, today's normal, recommend to check at home. Venous insufficiency, B:  See last visit, he complained of edema, saw the vascular specialist, was Rx weight loss, compression stocking, elevation of the legs. At the time he was complaining of pain in the legs, felt to the MSK issue. Swelling is much better today, recommend to continue using the compression stockings. Pain, lower extremities: As described above, does not have PVD, does have some venous insufficiency but is well treated. Suspect pain is RLS related.  Plan: Requip 0.25 mg daily, twice daily and 3 times daily.  See AVS. RLS: See above Low testosterone: On testosterone gel, checking testosterone and a PSA RTC 4 months

## 2021-09-11 ENCOUNTER — Encounter: Payer: Self-pay | Admitting: Internal Medicine

## 2021-09-14 ENCOUNTER — Other Ambulatory Visit: Payer: Self-pay | Admitting: Internal Medicine

## 2021-09-17 ENCOUNTER — Other Ambulatory Visit: Payer: Self-pay | Admitting: Internal Medicine

## 2021-09-21 ENCOUNTER — Encounter: Payer: Self-pay | Admitting: Internal Medicine

## 2021-10-04 ENCOUNTER — Other Ambulatory Visit: Payer: Self-pay | Admitting: Internal Medicine

## 2021-10-16 ENCOUNTER — Other Ambulatory Visit: Payer: Self-pay | Admitting: Internal Medicine

## 2021-12-10 ENCOUNTER — Telehealth: Payer: Self-pay | Admitting: Internal Medicine

## 2021-12-10 ENCOUNTER — Other Ambulatory Visit: Payer: Self-pay | Admitting: Internal Medicine

## 2021-12-10 NOTE — Telephone Encounter (Signed)
Rx sent 

## 2021-12-10 NOTE — Telephone Encounter (Signed)
Medication: sildenafil (REVATIO) 20 MG tablet   Has the patient contacted their pharmacy? Yes.     Preferred Pharmacy (with phone number or street name): [id number] [name], [place] - [address] [place] [address] Phone: [phone]   Agent: Please be advised that RX refills may take up to 3 business days. We ask that you follow-up with your pharmacy.

## 2021-12-22 ENCOUNTER — Ambulatory Visit: Payer: Managed Care, Other (non HMO) | Admitting: Internal Medicine

## 2021-12-22 ENCOUNTER — Encounter: Payer: Self-pay | Admitting: Internal Medicine

## 2021-12-22 VITALS — BP 148/92 | HR 64 | Temp 98.0°F | Ht 71.0 in | Wt 247.2 lb

## 2021-12-22 DIAGNOSIS — E119 Type 2 diabetes mellitus without complications: Secondary | ICD-10-CM | POA: Diagnosis not present

## 2021-12-22 DIAGNOSIS — Z23 Encounter for immunization: Secondary | ICD-10-CM | POA: Diagnosis not present

## 2021-12-22 DIAGNOSIS — R030 Elevated blood-pressure reading, without diagnosis of hypertension: Secondary | ICD-10-CM | POA: Diagnosis not present

## 2021-12-22 DIAGNOSIS — E7800 Pure hypercholesterolemia, unspecified: Secondary | ICD-10-CM

## 2021-12-22 DIAGNOSIS — J029 Acute pharyngitis, unspecified: Secondary | ICD-10-CM | POA: Diagnosis not present

## 2021-12-22 DIAGNOSIS — F439 Reaction to severe stress, unspecified: Secondary | ICD-10-CM

## 2021-12-22 LAB — POCT RAPID STREP A (OFFICE): Rapid Strep A Screen: NEGATIVE

## 2021-12-22 LAB — LIPID PANEL
Cholesterol: 150 mg/dL (ref 0–200)
HDL: 44.3 mg/dL (ref >39.00)
LDL Cholesterol: 95 mg/dL (ref 0–99)
NonHDL: 105.26
Total CHOL/HDL Ratio: 3
Triglycerides: 52 mg/dL (ref 0.0–149.0)
VLDL: 10.4 mg/dL (ref 0.0–40.0)

## 2021-12-22 LAB — HEMOGLOBIN A1C: Hgb A1c MFr Bld: 6.6 % — ABNORMAL HIGH (ref 4.6–6.5)

## 2021-12-22 MED ORDER — ACYCLOVIR 200 MG PO CAPS: 200.0000 mg | ORAL_CAPSULE | Freq: Two times a day (BID) | ORAL | 1 refills | Status: DC

## 2021-12-22 MED ORDER — AZELASTINE HCL 0.1 % NA SOLN: 2.0000 | Freq: Two times a day (BID) | NASAL | 6 refills | Status: DC

## 2021-12-22 MED ORDER — BENZONATATE 200 MG PO CAPS: 200.0000 mg | ORAL_CAPSULE | Freq: Three times a day (TID) | ORAL | 0 refills | Status: DC | PRN

## 2021-12-22 MED ORDER — POTASSIUM CHLORIDE CRYS ER 10 MEQ PO TBCR
10.0000 meq | EXTENDED_RELEASE_TABLET | Freq: Every day | ORAL | 0 refills | Status: DC
Start: 2021-12-22 — End: 2022-11-05

## 2021-12-22 MED ORDER — FUROSEMIDE 20 MG PO TABS: 20.0000 mg | ORAL_TABLET | Freq: Every day | ORAL | 0 refills | Status: DC

## 2021-12-22 MED ORDER — ACYCLOVIR 5 % EX OINT: TOPICAL_OINTMENT | CUTANEOUS | 2 refills | Status: DC

## 2021-12-22 NOTE — Patient Instructions (Addendum)
Rest, fluids, Tylenol. Tessalon Perles, prescription sent, take it as needed for cough Astepro nasal spray: 2 sprays on each side of the nose twice daily to help with nasal congestion.   Vaccines I recommend:  Covid booster Flu shot  Your blood pressure today was moderately elevated, check every other day.  Watch her salt intake. BP GOAL is between 110/65 and  135/85. If it is consistently higher or lower, let me know     GO TO THE LAB : Get the blood work     GO TO THE FRONT DESK, PLEASE SCHEDULE YOUR APPOINTMENTS Come back for a physical exam in 3 months   Per our records you are due for your diabetic eye exam. Please contact your eye doctor to schedule an appointment. Please have them send copies of your office visit notes to [place]. Our fax number is [phone]. If you need a referral to an eye doctor please let us know.
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
## 2021-12-25 ENCOUNTER — Encounter: Payer: Self-pay | Admitting: Internal Medicine

## 2022-02-07 ENCOUNTER — Other Ambulatory Visit: Payer: Self-pay | Admitting: Family

## 2022-02-15 ENCOUNTER — Telehealth: Payer: Self-pay

## 2022-02-15 MED ORDER — TESTOSTERONE 40.5 MG/2.5GM (1.62%) TD GEL: TRANSDERMAL | 0 refills | Status: DC

## 2022-02-15 NOTE — Telephone Encounter (Signed)
Requesting:testosterone  Contract: n/a  UDS: n/a Last Visit: 12/22/21 Next Visit: 03/24/22 Last Refill: 09/17/21 #150 and 3RF   Please Advise

## 2022-02-15 NOTE — Telephone Encounter (Signed)
PDMP okay, 3-month supply sent 

## 2022-02-24 ENCOUNTER — Telehealth: Payer: Self-pay | Admitting: Internal Medicine

## 2022-02-25 NOTE — Telephone Encounter (Signed)
Use the cream as needed, Rx sent

## 2022-02-25 NOTE — Telephone Encounter (Signed)
Refill request for clotrimazole- betamethasone cream. No longer on med list. Please advise.

## 2022-03-24 ENCOUNTER — Encounter: Payer: Self-pay | Admitting: Internal Medicine

## 2022-03-24 ENCOUNTER — Ambulatory Visit: Payer: Managed Care, Other (non HMO) | Admitting: Internal Medicine

## 2022-03-24 VITALS — BP 136/78 | HR 87 | Temp 98.3°F | Resp 18 | Ht 71.0 in | Wt 255.1 lb

## 2022-03-24 DIAGNOSIS — Z0001 Encounter for general adult medical examination with abnormal findings: Secondary | ICD-10-CM

## 2022-03-24 DIAGNOSIS — E291 Testicular hypofunction: Secondary | ICD-10-CM | POA: Diagnosis not present

## 2022-03-24 DIAGNOSIS — Z0000 Encounter for general adult medical examination without abnormal findings: Secondary | ICD-10-CM

## 2022-03-24 DIAGNOSIS — Z1211 Encounter for screening for malignant neoplasm of colon: Secondary | ICD-10-CM

## 2022-03-24 DIAGNOSIS — E119 Type 2 diabetes mellitus without complications: Secondary | ICD-10-CM | POA: Diagnosis not present

## 2022-03-24 DIAGNOSIS — E559 Vitamin D deficiency, unspecified: Secondary | ICD-10-CM | POA: Diagnosis not present

## 2022-03-24 DIAGNOSIS — R0683 Snoring: Secondary | ICD-10-CM

## 2022-03-24 DIAGNOSIS — E7800 Pure hypercholesterolemia, unspecified: Secondary | ICD-10-CM | POA: Diagnosis not present

## 2022-03-24 DIAGNOSIS — Z0189 Encounter for other specified special examinations: Secondary | ICD-10-CM

## 2022-03-24 LAB — CBC WITH DIFFERENTIAL/PLATELET
Basophils Absolute: 0 10*3/uL (ref 0.0–0.1)
Basophils Relative: 0.4 % (ref 0.0–3.0)
Eosinophils Absolute: 0.2 10*3/uL (ref 0.0–0.7)
Eosinophils Relative: 2.4 % (ref 0.0–5.0)
HCT: 42 % (ref 39.0–52.0)
Hemoglobin: 14.1 g/dL (ref 13.0–17.0)
Lymphocytes Relative: 22.5 % (ref 12.0–46.0)
Lymphs Abs: 2.1 10*3/uL (ref 0.7–4.0)
MCHC: 33.5 g/dL (ref 30.0–36.0)
MCV: 88.9 fl (ref 78.0–100.0)
Monocytes Absolute: 0.6 10*3/uL (ref 0.1–1.0)
Monocytes Relative: 6 % (ref 3.0–12.0)
Neutro Abs: 6.5 10*3/uL (ref 1.4–7.7)
Neutrophils Relative %: 68.7 % (ref 43.0–77.0)
Platelets: 339 10*3/uL (ref 150.0–400.0)
RBC: 4.73 Mil/uL (ref 4.22–5.81)
RDW: 13.7 % (ref 11.5–15.5)
WBC: 9.5 10*3/uL (ref 4.0–10.5)

## 2022-03-24 LAB — COMPREHENSIVE METABOLIC PANEL
ALT: 10 U/L (ref 0–53)
AST: 15 U/L (ref 0–37)
Albumin: 4.2 g/dL (ref 3.5–5.2)
Alkaline Phosphatase: 67 U/L (ref 39–117)
BUN: 12 mg/dL (ref 6–23)
CO2: 29 mEq/L (ref 19–32)
Calcium: 9.9 mg/dL (ref 8.4–10.5)
Chloride: 103 mEq/L (ref 96–112)
Creatinine, Ser: 0.89 mg/dL (ref 0.40–1.50)
GFR: 96.83 mL/min (ref >60.00)
Glucose, Bld: 96 mg/dL (ref 70–99)
Potassium: 4.4 mEq/L (ref 3.5–5.1)
Sodium: 139 mEq/L (ref 135–145)
Total Bilirubin: 0.8 mg/dL (ref 0.2–1.2)
Total Protein: 7.4 g/dL (ref 6.0–8.3)

## 2022-03-24 LAB — MICROALBUMIN / CREATININE URINE RATIO
Creatinine,U: 125.5 mg/dL
Microalb Creat Ratio: 2.3 mg/g (ref 0.0–30.0)
Microalb, Ur: 2.9 mg/dL — ABNORMAL HIGH (ref 0.0–1.9)

## 2022-03-24 LAB — TESTOSTERONE: Testosterone: 850.01 ng/dL (ref 300.00–890.00)

## 2022-03-24 LAB — VITAMIN D 25 HYDROXY (VIT D DEFICIENCY, FRACTURES): VITD: 30.88 ng/mL (ref 30.00–100.00)

## 2022-03-24 LAB — HEMOGLOBIN A1C: Hgb A1c MFr Bld: 6.6 % — ABNORMAL HIGH (ref 4.6–6.5)

## 2022-03-24 NOTE — Patient Instructions (Addendum)
Call gastroenterology next week, arrange for a colonoscopy. [place]  blood pressure regularly BP GOAL is between 110/65 and  135/85. If it is consistently higher or lower, let me know      GO TO THE LAB : Get the blood work     [place], [place] back for checkup in 6 months    Per our records you are due for your diabetic eye exam. Please contact your eye doctor to schedule an appointment. Please have them send copies of your office visit notes to [place]. Our fax number is [phone]. If you need a referral to an eye doctor please let us know.      "[place] of attorney" ,  "Living will" (Advance care planning documents)  If you already have a living will or healthcare power of attorney, is recommended you bring the copy to be scanned in your chart.   The document will be available to all the doctors you see in the system.  Advance care planning is a process that supports adults in  understanding and sharing their preferences regarding future medical care.  The patient's preferences are recorded in documents called Advance Directives and the can be modified at any time while the patient is in full mental capacity.   If you don't have one, please consider create one.      More information at: [email or web address]

## 2022-03-24 NOTE — Assessment & Plan Note (Signed)
-  Td 2015 - s/p Shingrix - COVID vax: booster rec - s/p flu shot    -CCS: cscope 2010 , Dr Ardis Hughs, 1 polyp, 10 years; will refer him, encouraged to call  -Prostate cancer screening: last PSAwnl -Labs : CMP CBC A1c total testosterone vitamin D -Diet and exercise: Discussed -Healthcare POA: See AVS

## 2022-03-24 NOTE — Progress Notes (Signed)
Subjective:    Patient ID: Gregory Santos, male    DOB: 1965/06/02, 57 y.o.   MRN: [id number]  DOS:  03/24/2022 Type of visit - description: CPX  Since the last office visit is doing well.  Denies any major concerns.  Review of Systems   A 14 point review of systems is negative    Past Medical History:  Diagnosis Date   Anxiety    BELLS PALSY 05/25/2007   Qualifier: History of  By: Lenna Gilford MD, Deborra Medina    Constipation    Headache(784.0)    Hemorrhoids    History of Bell's palsy    History of iron deficiency    HSV (herpes simplex virus) infection    Hypercholesteremia    Obesity    OSA (obstructive sleep apnea)    Restless leg syndrome    Testosterone deficiency    Vitamin D deficiency     Past Surgical History:  Procedure Laterality Date   right inguinal hernia repair as a child     Social History   Socioeconomic History   Marital status: Married    Spouse name: Not on file   Number of children: 0   Years of education: Not on file   Highest education level: Not on file  Occupational History   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  Other Topics Concern   Not on file  Social History Narrative   Lives w/ wife   Social Determinants of Health   Financial Resource Strain: Not on file  Food Insecurity: Not on file  Transportation Needs: Not on file  Physical Activity: Not on file  Stress: Not on file  Social Connections: Not on file  Intimate Partner Violence: Not on file    Current Outpatient Medications  Medication Instructions   acyclovir (ZOVIRAX) 200 mg, Oral, 2 times daily   acyclovir ointment (ZOVIRAX) 5 % APPLY TOPICALLY EVERY 3  HOURS AS DIRECTED   atorvastatin (LIPITOR) 20 mg, Oral, Daily   benzonatate (TESSALON) 200  mg, Oral, 3 times daily PRN   cholecalciferol (VITAMIN D) 2,000 Units, Oral, Daily   clotrimazole-betamethasone (LOTRISONE) cream APPLY TO AFFECTED AREA EVERY DAY AS NEEDED   furosemide (LASIX) 20 mg, Oral, Daily   potassium chloride (KLOR-CON M10) 10 MEQ tablet 10 mEq, Oral, Daily   rOPINIRole (REQUIP) 0.25 mg, Oral, 3 times daily   sildenafil (REVATIO) 20 MG tablet TAKE 3 TO 4 TABLETS BY MOUTH EVERY NIGHT AT BEDTIME AS NEEDED   Testosterone 40.5 MG/2.5GM (1.62%) GEL APPLY 2 PACKS TO UPPER ARMS IN AM AFTER SHOWER. Lorenzo HANDS, COVER W/ CLOTHES. DON'T [place] FOR 2 HRS       Objective:   Physical Exam BP 136/78   Pulse 87   Temp 98.3 F (36.8 C) (Oral)   Resp 18   Ht '5\' 11"'$  (1.803 m)   Wt 255 lb 2 oz (115.7 kg)   SpO2 97%   BMI 35.58 kg/m  General: Well developed, NAD, BMI noted Neck: No  thyromegaly  HEENT:  Normocephalic . Face symmetric, atraumatic Lungs:  CTA B Normal respiratory effort, no intercostal retractions, no accessory muscle use. Heart: RRR,  no murmur.  Abdomen:  Not distended, soft, non-tender.  No rebound or rigidity.   DM foot exam: No edema, good pedal pulses, pinprick examination normal Skin: Exposed areas without rash. Not pale. Not jaundice Neurologic:  alert & oriented X3.  Speech normal, gait appropriate for age and unassisted Strength symmetric and appropriate for age.  Psych: Cognition and judgment appear intact.  Cooperative with normal attention span and concentration.  Behavior appropriate. No anxious or depressed appearing.     Assessment     Assessment  DM dx 07-2021 A1C 6.9 hyperlipidemia Anxiety OSA, RLS sleep study 2006 w/ RDI=17 & mod snoring w/ signif leg jerks; never tried CPAP, uses  Requip  "prn"; states snoring is gone w/ wt reduction  Obesity Low testosterone (f/u pcp) -->   known hx atrophic testes likely from mumps orchitis, on HRT --> sx , improved subjectively Genital HSV -- acyclovir qd , acyclovir cream Vitamin D  deficiency 2010 Venous  insufficiency bilaterally (saw vascular 07/07/2021). Edema: Lasix/K+ as needed Dermatitis: Uses as needed Lotrisone-needs to at the groins +FH CAD father  ---- H/o Recurrent cough H/o hemorrhoids  H/o Bell's palsy Nail dystrophy: penlac ,creams prn  PLAN Here for CPX DM: Diet controlled, check A1c.  Feet exam negative.  Check micro. Hyperlipidemia: On atorvastatin, well-controlled. OSA: Diagnosed remotely 2006, never tried CPAP. Reports mild snoring, admits that he could easily fall asleep if inactive.  Epworth scale: 15. Plan: Refer to neuro for further eval. Low testosterone: On supplements, checking levels and a CBC.  Last PSA normal Vitamin D deficiency: On supplements, checking levels. RTC 6 months

## 2022-03-24 NOTE — Assessment & Plan Note (Signed)
Here for CPX DM: Diet controlled, check A1c.  Feet exam negative.  Check micro. Hyperlipidemia: On atorvastatin, well-controlled. OSA: Diagnosed remotely 2006, never tried CPAP. Reports mild snoring, admits that he could easily fall asleep if inactive.  Epworth scale: 15. Plan: Refer to neuro for further eval. Low testosterone: On supplements, checking levels and a CBC.  Last PSA normal Vitamin D deficiency: On supplements, checking levels. RTC 6 months

## 2022-04-04 ENCOUNTER — Other Ambulatory Visit: Payer: Self-pay | Admitting: Internal Medicine

## 2022-04-27 ENCOUNTER — Other Ambulatory Visit: Payer: Self-pay | Admitting: Internal Medicine

## 2022-05-06 ENCOUNTER — Encounter: Payer: Self-pay | Admitting: Internal Medicine

## 2022-07-11 ENCOUNTER — Telehealth: Payer: Self-pay | Admitting: Internal Medicine

## 2022-07-12 ENCOUNTER — Telehealth: Payer: Self-pay | Admitting: Internal Medicine

## 2022-07-12 NOTE — Telephone Encounter (Signed)
Will wait to see if needed per pharmacy.

## 2022-07-12 NOTE — Telephone Encounter (Signed)
Requesting: testosterone  Contract:  n/a UDS:n/a Last Visit: 03/24/22 Next Visit: 09/20/22 Last Refill: 02/15/22 #120 and 0RF  Please Advise

## 2022-07-12 NOTE — Telephone Encounter (Signed)
PDMP okay, Rx sent 

## 2022-07-12 NOTE — Telephone Encounter (Signed)
Gregory Santos (spouse [name]) stated that a PA would be required for the following medication. Per pt chart, previous PA for this medication isn't set to expire until 4.6.26 (not sure if this is a typo).  Prescription Request  07/12/2022  Is this a "Controlled Substance" medicine? Yes  LOV: 03/24/2022  What is the name of the medication or equipment?   Testosterone 40.5 MG/2.5GM (1.62%) GEL [[id number]]   Have you contacted your pharmacy to request a refill? No   Which pharmacy would you like this sent to?   [id number] [name], [place] - [name] [address] Phone: [phone] Fax: [phone]  Patient notified that their request is being sent to the clinical staff for review and that they should receive a response within 2 business days.   Please advise at Mobile [phone] (mobile)

## 2022-09-17 ENCOUNTER — Ambulatory Visit: Payer: Managed Care, Other (non HMO) | Admitting: Internal Medicine

## 2022-09-20 ENCOUNTER — Encounter: Payer: Self-pay | Admitting: Internal Medicine

## 2022-09-20 ENCOUNTER — Ambulatory Visit: Payer: Managed Care, Other (non HMO) | Admitting: Internal Medicine

## 2022-09-20 VITALS — BP 134/82 | HR 62 | Temp 98.1°F | Resp 16 | Ht 71.0 in | Wt 254.0 lb

## 2022-09-20 DIAGNOSIS — E119 Type 2 diabetes mellitus without complications: Secondary | ICD-10-CM

## 2022-09-20 DIAGNOSIS — E7800 Pure hypercholesterolemia, unspecified: Secondary | ICD-10-CM

## 2022-09-20 DIAGNOSIS — E291 Testicular hypofunction: Secondary | ICD-10-CM

## 2022-09-20 LAB — LIPID PANEL
Cholesterol: 161 mg/dL (ref 0–200)
HDL: 40.8 mg/dL (ref >39.00)
LDL Cholesterol: 109 mg/dL — ABNORMAL HIGH (ref 0–99)
NonHDL: 120.52
Total CHOL/HDL Ratio: 4
Triglycerides: 60 mg/dL (ref 0.0–149.0)
VLDL: 12 mg/dL (ref 0.0–40.0)

## 2022-09-20 LAB — HEMOGLOBIN A1C: Hgb A1c MFr Bld: 6.4 % (ref 4.6–6.5)

## 2022-09-20 LAB — TESTOSTERONE: Testosterone: 1200.97 ng/dL — ABNORMAL HIGH (ref 300.00–890.00)

## 2022-09-20 LAB — PSA: PSA: 0.57 ng/mL (ref 0.10–4.00)

## 2022-09-20 NOTE — Assessment & Plan Note (Signed)
DM: Diet controlled, I again emphasized the fact that he has diabetes, the meaning of A1c discussed with patient.  Room for improvement on diet, emphasize low carbohydrate diet.  Check A1c. Hyperlipidemia: On atorvastatin 20, although I told him that the last LDL was good, we will recheck FLP and consider to increase atorvastatin dose is to get LDL closer to 70.  He verbalized understanding OSA: Referral failed, states he will call neurology when his schedule allow to proceed with sleep study. Low testosterone: Good compliance with HRT, check a PSA and total testosterone. Vaccine advice provided. RTC CPX 02/2023

## 2022-09-20 NOTE — Patient Instructions (Addendum)
Vaccines I recommend; Flu shot this fall Please consider the new COVID  vaccine to be released in the market in few weeks  When you are ready for a sleep study contact neurology: [phone]     GO TO THE LAB : Get the blood work     GO TO THE FRONT DESK, PLEASE SCHEDULE YOUR APPOINTMENTS Come back for a physical exam by 02-2023.  Per our records you are due for your diabetic eye exam. Please contact your eye doctor to schedule an appointment. Please have them send copies of your office visit notes to [place]. Our fax number is [phone]. If you need a referral to an eye doctor please let us know.

## 2022-09-20 NOTE — Progress Notes (Signed)
Subjective:    Patient ID: Gregory Santos, male    DOB: 1965-09-14, 57 y.o.   MRN: [id number]  DOS:  09/20/2022 Type of visit - description: Here for follow-up  All chronic health problems were addressed.  Previous lab results discussed with the patient.  Denies any dysuria, gross hematuria or difficulty urinating. Diet has not been very healthy in the last few weeks  Review of Systems See above   Past Medical History:  Diagnosis Date   Anxiety    BELLS PALSY 05/25/2007   Qualifier: History of  By: Kriste Basque MD, Lonzo Cloud    Constipation    Headache(784.0)    Hemorrhoids    History of Bell's palsy    History of iron deficiency    HSV (herpes simplex virus) infection    Hypercholesteremia    Obesity    OSA (obstructive sleep apnea)    Restless leg syndrome    Testosterone deficiency    Vitamin D deficiency     Past Surgical History:  Procedure Laterality Date   right inguinal hernia repair as a child      Current Outpatient Medications  Medication Instructions   acyclovir (ZOVIRAX) 200 mg, Oral, 2 times daily   acyclovir ointment (ZOVIRAX) 5 % APPLY TOPICALLY EVERY 3  HOURS AS DIRECTED   atorvastatin (LIPITOR) 20 mg, Oral, Daily   cholecalciferol (VITAMIN D) 2,000 Units, Oral, Daily   clotrimazole-betamethasone (LOTRISONE) cream APPLY TO AFFECTED AREA EVERY DAY AS NEEDED   furosemide (LASIX) 20 mg, Oral, Daily   potassium chloride (KLOR-CON M10) 10 MEQ tablet 10 mEq, Oral, Daily   rOPINIRole (REQUIP) 0.25 mg, Oral, 3 times daily   sildenafil (REVATIO) 20 MG tablet TAKE 3 TO 4 TABLETS BY MOUTH EVERY NIGHT AT BEDTIME AS NEEDED   Testosterone 40.5 MG/2.5GM (1.62%) GEL APPLY 2 PACKS TO UPPER ARMS IN THE MORNING AFTER SHOWER. WASH HANDS, COVER W/ CLOTHES. DON'T WASH FOR 2 HOURS       Objective:   Physical Exam BP 134/82   Pulse 62   Temp 98.1 F (36.7 C) (Oral)   Resp 16   Ht 5\' 11"  (1.803 m)   Wt 254 lb (115.2 kg)   SpO2 97%   BMI 35.43 kg/m  General:   Well  developed, NAD, BMI noted. HEENT:  Normocephalic . Face symmetric, atraumatic Skin: Not pale. Not jaundice Neurologic:  alert & oriented X3.  Speech normal, gait appropriate for age and unassisted Psych--  Cognition and judgment appear intact.  Cooperative with normal attention span and concentration.  Behavior appropriate. No anxious or depressed appearing.      Assessment    Assessment  DM dx 07-2021 A1C 6.9 hyperlipidemia Anxiety OSA, RLS sleep study 2006 w/ RDI=17 & mod snoring w/ signif leg jerks; never tried CPAP, uses  Requip  "prn"; states snoring is gone w/ wt reduction  Obesity Low testosterone (f/u pcp) -->   known hx atrophic testes likely from mumps orchitis, on HRT --> sx , improved subjectively Genital HSV -- acyclovir qd , acyclovir cream Vitamin D deficiency 2010 Venous  insufficiency bilaterally (saw vascular 07/07/2021). Edema: Lasix/K+ as needed Dermatitis: Uses as needed Lotrisone-needs to at the groins +FH CAD father  H/o Recurrent cough H/o hemorrhoids  H/o Bell's palsy Nail dystrophy: penlac ,creams prn  PLAN DM: Diet controlled, I again emphasized the fact that he has diabetes, the meaning of A1c discussed with patient.  Room for improvement on diet, emphasize low carbohydrate diet.  Check  A1c. Hyperlipidemia: On atorvastatin 20, although I told him that the last LDL was good, we will recheck FLP and consider to increase atorvastatin dose is to get LDL closer to 70.  He verbalized understanding OSA: Referral failed, states he will call neurology when his schedule allow to proceed with sleep study. Low testosterone: Good compliance with HRT, check a PSA and total testosterone. Vaccine advice provided. RTC CPX 02/2023

## 2022-09-24 ENCOUNTER — Ambulatory Visit: Payer: Managed Care, Other (non HMO) | Admitting: Internal Medicine

## 2022-09-24 NOTE — Addendum Note (Signed)
Addended by: Conrad Corazon D on: 09/24/2022 09:45 AM   Modules accepted: Orders

## 2022-10-07 ENCOUNTER — Other Ambulatory Visit: Payer: Self-pay | Admitting: Internal Medicine

## 2022-10-10 ENCOUNTER — Telehealth: Payer: Self-pay | Admitting: Internal Medicine

## 2022-10-11 MED ORDER — TESTOSTERONE 40.5 MG/2.5GM (1.62%) TD GEL: 2.0000 | Freq: Every morning | TRANSDERMAL | 1 refills | Status: DC

## 2022-10-11 NOTE — Telephone Encounter (Signed)
Requesting: testosterone  Contract: n/a UDS: n/a  Last Visit: 09/20/22 Next Visit: 04/05/23 Last Refill: 07/12/22 #180 and 0RF   Please Advise

## 2022-10-11 NOTE — Telephone Encounter (Signed)
PDMP okay, Rx sent 

## 2022-10-29 ENCOUNTER — Other Ambulatory Visit: Payer: Self-pay | Admitting: Internal Medicine

## 2022-10-29 DIAGNOSIS — Z1212 Encounter for screening for malignant neoplasm of rectum: Secondary | ICD-10-CM

## 2022-10-29 DIAGNOSIS — Z1211 Encounter for screening for malignant neoplasm of colon: Secondary | ICD-10-CM

## 2022-11-04 ENCOUNTER — Telehealth: Payer: Self-pay | Admitting: Internal Medicine

## 2022-11-04 NOTE — Telephone Encounter (Signed)
Please advise? I believe she is referring to the rectal cream

## 2022-11-04 NOTE — Telephone Encounter (Signed)
Pt's wife called back to request refill on hydrocortisone cream. Medication was discontinued so did not show up on current list of meds. [facility] [address] and call pt to advise.

## 2022-11-05 ENCOUNTER — Telehealth: Payer: Self-pay | Admitting: Internal Medicine

## 2022-11-05 MED ORDER — FUROSEMIDE 20 MG PO TABS: 20.0000 mg | ORAL_TABLET | Freq: Every day | ORAL | 0 refills | Status: DC

## 2022-11-05 MED ORDER — ROPINIROLE HCL 0.25 MG PO TABS: 0.2500 mg | ORAL_TABLET | Freq: Three times a day (TID) | ORAL | 3 refills | Status: DC

## 2022-11-05 MED ORDER — POTASSIUM CHLORIDE CRYS ER 10 MEQ PO TBCR: 10.0000 meq | EXTENDED_RELEASE_TABLET | Freq: Every day | ORAL | 0 refills | Status: DC

## 2022-11-05 MED ORDER — HYDROCORTISONE (PERIANAL) 2.5 % EX CREA: TOPICAL_CREAM | Freq: Two times a day (BID) | CUTANEOUS | 0 refills | Status: DC | PRN

## 2022-11-05 MED ORDER — ACYCLOVIR 200 MG PO CAPS: 200.0000 mg | ORAL_CAPSULE | Freq: Two times a day (BID) | ORAL | 1 refills | Status: DC

## 2022-11-05 NOTE — Telephone Encounter (Signed)
Pt's wife requesting all of patient's medications [facility].

## 2022-11-05 NOTE — Addendum Note (Signed)
Addended by: Willow Ora E on: 11/05/2022 06:09 AM   Modules accepted: Orders

## 2022-11-05 NOTE — Addendum Note (Signed)
Addended byConrad Oak[place] D on: 11/05/2022 01:09 PM   Modules accepted: Orders

## 2022-11-05 NOTE — Telephone Encounter (Signed)
Advised patient I a refill hydrocortisone for rectal use.  If he having severe pain or bleeding he needs to be seen.

## 2022-11-05 NOTE — Telephone Encounter (Signed)
Rxs sent

## 2022-11-05 NOTE — Telephone Encounter (Signed)
Spoke w/ Darnelle Maffucci- Pt's wife, informed Rx sent. To be seen if he is having severe pain or bleeding. Stacie verbalized understanding.

## 2022-11-23 ENCOUNTER — Other Ambulatory Visit: Payer: Managed Care, Other (non HMO)

## 2022-11-24 LAB — COLOGUARD: COLOGUARD: NEGATIVE

## 2022-12-25 ENCOUNTER — Other Ambulatory Visit: Payer: Self-pay | Admitting: Internal Medicine

## 2023-03-10 ENCOUNTER — Other Ambulatory Visit: Payer: Self-pay | Admitting: Internal Medicine

## 2023-03-10 ENCOUNTER — Telehealth: Payer: Self-pay | Admitting: Internal Medicine

## 2023-03-10 MED ORDER — HYDROCORTISONE (PERIANAL) 2.5 % EX CREA: TOPICAL_CREAM | Freq: Two times a day (BID) | CUTANEOUS | 0 refills | Status: DC | PRN

## 2023-03-10 MED ORDER — FUROSEMIDE 20 MG PO TABS
20.0000 mg | ORAL_TABLET | Freq: Every day | ORAL | 0 refills | Status: DC
Start: 2023-03-10 — End: 2023-11-14

## 2023-03-10 MED ORDER — ATORVASTATIN CALCIUM 20 MG PO TABS: 20.0000 mg | ORAL_TABLET | Freq: Every day | ORAL | 0 refills | Status: DC

## 2023-03-10 MED ORDER — ACYCLOVIR 200 MG PO CAPS: 200.0000 mg | ORAL_CAPSULE | Freq: Two times a day (BID) | ORAL | 0 refills | Status: DC

## 2023-03-10 MED ORDER — POTASSIUM CHLORIDE CRYS ER 10 MEQ PO TBCR: 10.0000 meq | EXTENDED_RELEASE_TABLET | Freq: Every day | ORAL | 0 refills | Status: DC

## 2023-03-10 MED ORDER — ROPINIROLE HCL 0.25 MG PO TABS: 0.2500 mg | ORAL_TABLET | Freq: Three times a day (TID) | ORAL | 0 refills | Status: DC

## 2023-03-10 MED ORDER — CLOTRIMAZOLE-BETAMETHASONE 1-0.05 % EX CREA: TOPICAL_CREAM | Freq: Every day | CUTANEOUS | 1 refills | Status: AC | PRN

## 2023-03-10 MED ORDER — SILDENAFIL CITRATE 20 MG PO TABS: ORAL_TABLET | ORAL | 3 refills | Status: DC

## 2023-03-10 NOTE — Telephone Encounter (Signed)
Copied from CRM [id number]. Topic: Clinical - Medication Refill >> Mar 10, 2023  3:04 PM Pascal Lux wrote: Most Recent Primary Care Visit:  Provider: Willow Ora E  Department: [facility]  Visit Type: OFFICE VISIT  Date: 09/20/2022  Medication: acyclovir (ZOVIRAX) 200 MG capsule [[id number]] - atorvastatin (LIPITOR) 20 MG tablet [[id number]] cholecalciferol (VITAMIN D) 1000 units tablet [[id number]] clotrimazole-betamethasone (LOTRISONE) cream [[id number]] furosemide (LASIX) 20 MG tablet [[id number]] hydrocortisone (ANUSOL-HC) 2.5 % rectal cream [[id number]] potassium chloride (KLOR-CON M10) 10 MEQ tablet [[id number]] rOPINIRole (REQUIP) 0.25 MG tablet [[id number]] Testosterone 40.5 MG/2.5GM (1.62%) GEL [[id number]]  Has the patient contacted their pharmacy? Yes (Agent: If no, request that the patient contact the pharmacy for the refill. If patient does not wish to contact the pharmacy document the reason why and proceed with request.) (Agent: If yes, when and what did the pharmacy advise?) - Pharmacy advised to call provider  Is this the correct pharmacy for this prescription? Yes If no, delete pharmacy and type the correct one.  This is the patient's preferred pharmacy:  [id number] [name], [place] - [address] [place] [address] Phone: [phone] Fax: [phone]   Has the prescription been filled recently? No  Is the patient out of the medication? Yes  Has the patient been seen for an appointment in the last year OR does the patient have an upcoming appointment? Yes  Can we respond through MyChart? No  Agent: Please be advised that Rx refills may take up to 3 business days. We ask that you follow-up with your pharmacy.

## 2023-03-10 NOTE — Telephone Encounter (Signed)
Copied from CRM [id number]. Topic: Clinical - Medication Refill >> Mar 10, 2023  3:14 PM Pascal Lux wrote: Most Recent Primary Care Visit:  Provider: Wanda Plump  Department: [facility]  Visit Type: OFFICE VISIT  Date: 09/20/2022  Medication: sildenafil (REVATIO) 20 MG tablet [[id number]]  Has the patient contacted their pharmacy? Yes (Agent: If no, request that the patient contact the pharmacy for the refill. If patient does not wish to contact the pharmacy document the reason why and proceed with request.) (Agent: If yes, when and what did the pharmacy advise?) stated to contact provider.  Is this the correct pharmacy for this prescription? Yes If no, delete pharmacy and type the correct one.  This is the patient's preferred pharmacy:    [id number] - [place],  - [address] [place] [address] Phone: [phone] Fax: [phone]   Has the prescription been filled recently? No  Is the patient out of the medication? Yes  Has the patient been seen for an appointment in the last year OR does the patient have an upcoming appointment? Yes  Can we respond through MyChart? No  Agent: Please be advised that Rx refills may take up to 3 business days. We ask that you follow-up with your pharmacy.

## 2023-03-10 NOTE — Telephone Encounter (Signed)
Needs labs before RF. Is he taking atorvastatin 40 mg? Is he taking testosterone as prescribed?

## 2023-03-10 NOTE — Telephone Encounter (Signed)
Requesting: testosterone  Contract: n/a UDS: n/a Last Visit: 09/20/22 Next Visit: 04/05/23 Last Refill: 10/11/22 #180 and 1RF  Please Advise

## 2023-03-11 ENCOUNTER — Telehealth: Payer: Self-pay

## 2023-03-11 MED ORDER — TESTOSTERONE 40.5 MG/2.5GM (1.62%) TD GEL: 2.0000 | Freq: Every morning | TRANSDERMAL | 1 refills | Status: DC

## 2023-03-11 NOTE — Telephone Encounter (Signed)
He has a f/u appt scheduled for 04/05/23.

## 2023-03-11 NOTE — Telephone Encounter (Signed)
Testosterone sent, will check labs on 04/05/2023

## 2023-03-11 NOTE — Telephone Encounter (Signed)
Pt is taking atorvastatin- refills sent until next appt in March yesterday, 03/10/23.

## 2023-03-11 NOTE — Telephone Encounter (Signed)
PA initiated via Covermymeds; KEY: [id number]. Awaiting determination.

## 2023-03-18 ENCOUNTER — Other Ambulatory Visit (HOSPITAL_COMMUNITY): Payer: Self-pay

## 2023-03-18 NOTE — Telephone Encounter (Signed)
 Can you guys try to run a test claim please? I haven't received a response from insurance.

## 2023-03-21 ENCOUNTER — Other Ambulatory Visit (HOSPITAL_COMMUNITY): Payer: Self-pay

## 2023-03-21 NOTE — Telephone Encounter (Signed)
 Checked key in [facility] and ran test claim, PA was denied and test claim was unsuccessful

## 2023-03-24 NOTE — Telephone Encounter (Signed)
 Prior auth denied.

## 2023-04-05 ENCOUNTER — Encounter: Payer: Self-pay | Admitting: Internal Medicine

## 2023-04-05 ENCOUNTER — Encounter: Payer: Managed Care, Other (non HMO) | Admitting: Internal Medicine

## 2023-04-18 ENCOUNTER — Other Ambulatory Visit: Payer: Self-pay | Admitting: Internal Medicine

## 2023-04-18 NOTE — Telephone Encounter (Signed)
 Prescription sent. Let him know: Needs an appointment before next refill.

## 2023-04-18 NOTE — Telephone Encounter (Signed)
 Requesting: testosterone gel  Contract: n/a UDS: n/a Last Visit: 09/20/22 Next Visit: None Last Refill: 03/11/23 #60 and 1RF   Please Advise

## 2023-04-18 NOTE — Telephone Encounter (Signed)
 Letter mailed

## 2023-05-03 ENCOUNTER — Ambulatory Visit: Admitting: Internal Medicine

## 2023-05-03 ENCOUNTER — Encounter: Payer: Self-pay | Admitting: Internal Medicine

## 2023-05-03 VITALS — BP 138/82 | HR 64 | Temp 98.0°F | Resp 18 | Ht 71.0 in | Wt 241.1 lb

## 2023-05-03 DIAGNOSIS — M79641 Pain in right hand: Secondary | ICD-10-CM | POA: Diagnosis not present

## 2023-05-03 DIAGNOSIS — M79671 Pain in right foot: Secondary | ICD-10-CM

## 2023-05-03 DIAGNOSIS — R634 Abnormal weight loss: Secondary | ICD-10-CM

## 2023-05-03 DIAGNOSIS — M79642 Pain in left hand: Secondary | ICD-10-CM

## 2023-05-03 DIAGNOSIS — E119 Type 2 diabetes mellitus without complications: Secondary | ICD-10-CM

## 2023-05-03 DIAGNOSIS — E7800 Pure hypercholesterolemia, unspecified: Secondary | ICD-10-CM | POA: Diagnosis not present

## 2023-05-03 DIAGNOSIS — E291 Testicular hypofunction: Secondary | ICD-10-CM

## 2023-05-03 DIAGNOSIS — M79672 Pain in left foot: Secondary | ICD-10-CM

## 2023-05-03 LAB — BASIC METABOLIC PANEL WITH GFR
BUN: 11 mg/dL (ref 6–23)
CO2: 26 meq/L (ref 19–32)
Calcium: 9.3 mg/dL (ref 8.4–10.5)
Chloride: 103 meq/L (ref 96–112)
Creatinine, Ser: 0.84 mg/dL (ref 0.40–1.50)
GFR: 97.77 mL/min (ref >60.00)
Glucose, Bld: 102 mg/dL — ABNORMAL HIGH (ref 70–99)
Potassium: 4.1 meq/L (ref 3.5–5.1)
Sodium: 138 meq/L (ref 135–145)

## 2023-05-03 LAB — CBC WITH DIFFERENTIAL/PLATELET
Basophils Absolute: 0 10*3/uL (ref 0.0–0.1)
Basophils Relative: 0.3 % (ref 0.0–3.0)
Eosinophils Absolute: 0.2 10*3/uL (ref 0.0–0.7)
Eosinophils Relative: 2.1 % (ref 0.0–5.0)
HCT: 42.2 % (ref 39.0–52.0)
Hemoglobin: 13.8 g/dL (ref 13.0–17.0)
Lymphocytes Relative: 22.6 % (ref 12.0–46.0)
Lymphs Abs: 1.9 10*3/uL (ref 0.7–4.0)
MCHC: 32.7 g/dL (ref 30.0–36.0)
MCV: 89.8 fl (ref 78.0–100.0)
Monocytes Absolute: 0.4 10*3/uL (ref 0.1–1.0)
Monocytes Relative: 5 % (ref 3.0–12.0)
Neutro Abs: 5.8 10*3/uL (ref 1.4–7.7)
Neutrophils Relative %: 70 % (ref 43.0–77.0)
Platelets: 366 10*3/uL (ref 150.0–400.0)
RBC: 4.71 Mil/uL (ref 4.22–5.81)
RDW: 13.5 % (ref 11.5–15.5)
WBC: 8.2 10*3/uL (ref 4.0–10.5)

## 2023-05-03 LAB — TSH: TSH: 0.87 u[IU]/mL (ref 0.35–5.50)

## 2023-05-03 LAB — MICROALBUMIN / CREATININE URINE RATIO
Creatinine,U: 164.2 mg/dL
Microalb Creat Ratio: 29.4 mg/g (ref 0.0–30.0)
Microalb, Ur: 4.8 mg/dL — ABNORMAL HIGH (ref 0.0–1.9)

## 2023-05-03 LAB — HEMOGLOBIN A1C: Hgb A1c MFr Bld: 6.4 % (ref 4.6–6.5)

## 2023-05-03 LAB — TESTOSTERONE: Testosterone: 497.13 ng/dL (ref 300.00–890.00)

## 2023-05-03 MED ORDER — ATORVASTATIN CALCIUM 40 MG PO TABS: 40.0000 mg | ORAL_TABLET | Freq: Every day | ORAL | 1 refills | Status: DC

## 2023-05-03 NOTE — Progress Notes (Signed)
 Subjective:    Patient ID: Gregory Santos, male    DOB: 03-21-1965, 58 y.o.   MRN: [id number]  DOS:  05/03/2023 Type of visit - description: Acute.    Acute visit, overdue for a ROV, chronic medical problems addressed.  Reason for the visit today is bilateral foot and hand  pain. He works in a [employer], job is very physical, stands and  lifts "all day long" per patient. When asked, admits to occasional numbness distally of the feet.     Wt Readings from Last 3 Encounters:  05/03/23 241 lb 2 oz (109.4 kg)  09/20/22 254 lb (115.2 kg)  03/24/22 255 lb 2 oz (115.7 kg)     Review of Systems See above   Past Medical History:  Diagnosis Date   Anxiety    BELLS PALSY 05/25/2007   Qualifier: History of  By: Kriste Basque MD, Lonzo Cloud    Constipation    Headache(784.0)    Hemorrhoids    History of Bell's palsy    History of iron deficiency    HSV (herpes simplex virus) infection    Hypercholesteremia    Obesity    OSA (obstructive sleep apnea)    Restless leg syndrome    Testosterone deficiency    Vitamin D deficiency     Past Surgical History:  Procedure Laterality Date   right inguinal hernia repair as a child      Current Outpatient Medications  Medication Instructions   acyclovir (ZOVIRAX) 200 mg, Oral, 2 times daily   acyclovir ointment (ZOVIRAX) 5 % APPLY TOPICALLY EVERY 3  HOURS AS DIRECTED   atorvastatin (LIPITOR) 40 mg, Oral, Daily at bedtime   cholecalciferol (VITAMIN D) 2,000 Units, Daily   clotrimazole-betamethasone (LOTRISONE) cream Topical, Daily PRN   furosemide (LASIX) 20 mg, Oral, Daily   hydrocortisone (ANUSOL-HC) 2.5 % rectal cream Rectal, 2 times daily PRN   potassium chloride (KLOR-CON M10) 10 MEQ tablet 10 mEq, Oral, Daily   rOPINIRole (REQUIP) 0.25 mg, Oral, 3 times daily   sildenafil (REVATIO) 20 MG tablet TAKE THREE TO FOUR TABLETS BY MOUTH NIGHTLY AS NEEDED   Testosterone 40.5 MG/2.5GM (1.62%) GEL APPLY 2 PACKETS TO UPPER ARMS AFTER SHOWER EACH  MORNING (EXCEPT ON MONDAYS AND THURSDAYS, ONLY APPLY 1 PACKET).       Objective:   Physical Exam BP 138/82   Pulse 64   Temp 98 F (36.7 C) (Oral)   Resp 18   Ht 5\' 11"  (1.803 m)   Wt 241 lb 2 oz (109.4 kg)   SpO2 99%   BMI 33.63 kg/m  General:   Well developed, NAD, BMI noted. HEENT:  Normocephalic . Face symmetric, atraumatic Lungs:  CTA B Normal respiratory effort, no intercostal retractions, no accessory muscle use. Heart: RRR,  no murmur.  MSK: Hands: Calluses present at the palmar aspect, no synovitis Diabetes foot exam: Good pedal pulses, good toe perfusion, decreased pinprick examination distally bilaterally.  High arch noted.  No deformity.  No synovitis Skin: Not pale. Not jaundice Neurologic:  alert & oriented X3.  Speech normal, gait appropriate for age and unassisted Psych--  Cognition and judgment appear intact.  Cooperative with normal attention span and concentration.  Behavior appropriate. No anxious or depressed appearing.      Assessment     Assessment  DM dx 07-2021 A1C 6.9 hyperlipidemia Anxiety OSA, RLS sleep study 2006 w/ RDI=17 & mod snoring w/ signif leg jerks; never tried CPAP, uses  Requip  "prn"; states  snoring is gone w/ wt reduction  Obesity Low testosterone (f/u pcp) -->   known hx atrophic testes likely from mumps orchitis, on HRT --> sx , improved subjectively Genital HSV -- acyclovir qd , acyclovir cream Vitamin D deficiency 2010 Venous  insufficiency bilaterally (saw vascular 07/07/2021). Edema: Lasix/K+ as needed Dermatitis: Uses as needed Lotrisone-needs to at the groins +FH CAD father  H/o Recurrent cough H/o hemorrhoids  H/o Bell's palsy Nail dystrophy: penlac ,creams prn  PLAN Hand, feet pain: Has  hands and feet pain as described above, exam without synovitis, he does have some degree of neuropathy likely from DM. Works at [employer], very physical job.  The patient believe he needs disability. Plan: OT referral,  for disability evaluation.  Ortho for further management of hand and feet pain.  He already is using good-quality shoe inserts at work. DM: Last A1c 6.4.  Diet controlled.  Recheck BMP, A1c and micro. Neuropathy: Mild neuropathy, see physical exam.  Feet care discussed. Hyperlipidemia.  Last LDL 109, was recommended to increase atorvastatin to 40 mg however we did not send the prescription.  Rx atorvastatin 40 mg daily, FLP on RTC Hypogonadism: Last testosterone August 2024 slightly elevated, was recommended to decrease testosterone dose to: 1 pack Monday and Thursday, 2 pack all other days however  what he is doing is 2 packs Monday to Friday. Checking a CBC and a testosterone level. Weight loss: Weight loss noted, the patient reports no fever or chills, no headaches, no diarrhea or blood in the stools.  States he is eating smaller portions.  Check a TSH.   RTC 3 months CPX

## 2023-05-03 NOTE — Assessment & Plan Note (Signed)
 Hand, feet pain: Has  hands and feet pain as described above, exam without synovitis, he does have some degree of neuropathy likely from DM. Works at [employer], very physical job.  The patient believe he needs disability. Plan: OT referral, for disability evaluation.  Ortho for further management of hand and feet pain.  He already is using good-quality shoe inserts at work. DM: Last A1c 6.4.  Diet controlled.  Recheck BMP, A1c and micro. Neuropathy: Mild neuropathy, see physical exam.  Feet care discussed. Hyperlipidemia.  Last LDL 109, was recommended to increase atorvastatin to 40 mg however we did not send the prescription.  Rx atorvastatin 40 mg daily, FLP on RTC Hypogonadism: Last testosterone August 2024 slightly elevated, was recommended to decrease testosterone dose to: 1 pack Monday and Thursday, 2 pack all other days however  what he is doing is 2 packs Monday to Friday. Checking a CBC and a testosterone level. Weight loss: Weight loss noted, the patient reports no fever or chills, no headaches, no diarrhea or blood in the stools.  States he is eating smaller portions.  Check a TSH.   RTC 3 months CPX

## 2023-05-03 NOTE — Patient Instructions (Addendum)
 Will refer you to occupational therapy to see about disability Will refer you to orthopedics  You have developed neuropathy.  Please read information below    GO TO THE LAB : Get the blood work     Please go to the front desk: Arrange for a follow-up in 3 months    Per our records you are due for your diabetic eye exam. Please contact your eye doctor to schedule an appointment. Please have them send copies of your office visit notes to [place]. Our fax number is [phone]. If you need a referral to an eye doctor please let us know.  Diabetes Mellitus and Foot Care Diabetes, also called diabetes mellitus, may cause problems with your feet and legs because of poor blood flow (circulation). Poor circulation may make your skin: Become thinner and drier. Break more easily. Heal more slowly. Peel and crack. You may also have nerve damage (neuropathy). This can cause decreased feeling in your legs and feet. This means that you may not notice minor injuries to your feet that could lead to more serious problems. Finding and treating problems early is the best way to prevent future foot problems. How to care for your feet Foot hygiene  Wash your feet daily with warm water and mild soap. Do not use hot water. Then, pat your feet and the areas between your toes until they are fully dry. Do not soak your feet. This can dry your skin. Trim your toenails straight across. Do not dig under them or around the cuticle. File the edges of your nails with an emery board or nail file. Apply a moisturizing lotion or petroleum jelly to the skin on your feet and to dry, brittle toenails. Use lotion that does not contain alcohol and is unscented. Do not apply lotion between your toes. Shoes and socks Wear clean socks or stockings every day. Make sure they are not too tight. Do not wear knee-high stockings. These may decrease blood flow to your legs. Wear shoes that fit well and have enough cushioning. Always look  in your shoes before you put them on to be sure there are no objects inside. To break in new shoes, wear them for just a few hours a day. This prevents injuries on your feet. Wounds, scrapes, corns, and calluses  Check your feet daily for blisters, cuts, bruises, sores, and redness. If you cannot see the bottom of your feet, use a mirror or ask someone for help. Do not cut off corns or calluses or try to remove them with medicine. If you find a minor scrape, cut, or break in the skin on your feet, keep it and the skin around it clean and dry. You may clean these areas with mild soap and water. Do not clean the area with peroxide, alcohol, or iodine. If you have a wound, scrape, corn, or callus on your foot, look at it several times a day to make sure it is healing and not infected. Check for: Redness, swelling, or pain. Fluid or blood. Warmth. Pus or a bad smell. General tips Do not cross your legs. This may decrease blood flow to your feet. Do not use heating pads or hot water bottles on your feet. They may burn your skin. If you have lost feeling in your feet or legs, you may not know this is happening until it is too late. Protect your feet from hot and cold by wearing shoes, such as at the beach or on hot pavement.  Schedule a complete foot exam at least once a year or more often if you have foot problems. Report any cuts, sores, or bruises to your health care provider right away. Where to find more information American Diabetes Association: diabetes.org Association of Diabetes Care & Education Specialists: diabeteseducator.org Contact a health care provider if: You have a condition that increases your risk of infection, and you have any cuts, sores, or bruises on your feet. You have an injury that is not healing. You have redness on your legs or feet. You feel burning or tingling in your legs or feet. You have pain or cramps in your legs and feet. Your legs or feet are numb. Your feet  always feel cold. You have pain around any toenails. Get help right away if: You have a wound, scrape, corn, or callus on your foot and: You have signs of infection. You have a fever. You have a red line going up your leg. This information is not intended to replace advice given to you by your health care provider. Make sure you discuss any questions you have with your health care provider. Document Revised: 07/15/2021 Document Reviewed: 07/15/2021 Elsevier Patient Education  2024 [employer].

## 2023-05-19 ENCOUNTER — Other Ambulatory Visit: Payer: Self-pay

## 2023-05-19 ENCOUNTER — Other Ambulatory Visit (INDEPENDENT_AMBULATORY_CARE_PROVIDER_SITE_OTHER): Payer: Self-pay

## 2023-05-19 ENCOUNTER — Ambulatory Visit (INDEPENDENT_AMBULATORY_CARE_PROVIDER_SITE_OTHER): Admitting: Orthopaedic Surgery

## 2023-05-19 DIAGNOSIS — M79671 Pain in right foot: Secondary | ICD-10-CM | POA: Diagnosis not present

## 2023-05-19 DIAGNOSIS — M79641 Pain in right hand: Secondary | ICD-10-CM

## 2023-05-19 DIAGNOSIS — M79642 Pain in left hand: Secondary | ICD-10-CM | POA: Diagnosis not present

## 2023-05-19 DIAGNOSIS — M79672 Pain in left foot: Secondary | ICD-10-CM

## 2023-05-19 MED ORDER — GABAPENTIN 100 MG PO CAPS: 100.0000 mg | ORAL_CAPSULE | Freq: Every evening | ORAL | 3 refills | Status: AC | PRN

## 2023-05-19 NOTE — Progress Notes (Signed)
 Office Visit Note   Patient: Gregory Santos           Date of Birth: December 28, 1965           MRN: [id number] Visit Date: 05/19/2023              Requested by: Gregory Hollow, MD [address] [place],  [place] [address] PCP: Gregory Hollow, MD   Assessment & Plan: Visit Diagnoses:  1. Pain in both hands   2. Pain in both feet     Plan: Gregory Santos is a 58 year old gentleman with complaints of bilateral hand and foot pain and polyarthralgia.  Will obtain arthritis panel.  Symptoms could be due to the physical nature of the this new job that he started in January.  Since he is reporting nervelike symptoms I am going to put him on gabapentin .  We will notify him of the results of the lab work.  Follow-Up Instructions: No follow-ups on file.   Orders:  Orders Placed This Encounter  Procedures   XR Foot Complete Left   XR Foot Complete Right   XR Hand Complete Left   XR Hand Complete Right   Uric acid   Rheumatoid Factor   Sed Rate (ESR)   C-reactive protein   Meds ordered this encounter  Medications   gabapentin  (NEURONTIN ) 100 MG capsule    Sig: Take 1-3 capsules (100-300 mg total) by mouth at bedtime as needed.    Dispense:  30 capsule    Refill:  3      Procedures: No procedures performed   Clinical Data: No additional findings.   Subjective: Chief Complaint  Patient presents with   Right Foot - Pain   Left Foot - Pain    HPI Trigger is a 58 year old gentleman who comes in for evaluation of bilateral hand and foot pain.  He states that all of his joints hurt especially towards the end of the day.  He does a very physical laboring job.  He started a new job in January and the pain started in February.  Denies any injuries.  Denies any numbness and tingling.  Feels a stabbing nerve pain in his feet. Review of Systems  Constitutional: Negative.   HENT: Negative.    Eyes: Negative.   Respiratory: Negative.    Cardiovascular: Negative.   Gastrointestinal:  Negative.   Endocrine: Negative.   Genitourinary: Negative.   Skin: Negative.   Allergic/Immunologic: Negative.   Neurological: Negative.   Hematological: Negative.   Psychiatric/Behavioral: Negative.    All other systems reviewed and are negative.    Objective: Vital Signs: There were no vitals taken for this visit.  Physical Exam Vitals and nursing note reviewed.  Constitutional:      Appearance: He is well-developed.  HENT:     Head: Normocephalic and atraumatic.  Eyes:     Pupils: Pupils are equal, round, and reactive to light.  Pulmonary:     Effort: Pulmonary effort is normal.  Abdominal:     Palpations: Abdomen is soft.  Musculoskeletal:        General: Normal range of motion.     Cervical back: Neck supple.  Skin:    General: Skin is warm.  Neurological:     Mental Status: He is alert and oriented to person, place, and time.  Psychiatric:        Behavior: Behavior normal.        Thought Content: Thought content normal.  Judgment: Judgment normal.     Ortho Exam Exam of bilateral hands and feet show no abnormalities.  No swelling.  No neurovascular compromise.  Sensation intact. Specialty Comments:  No specialty comments available.  Imaging: XR Foot Complete Left Result Date: 05/19/2023 X-rays of the left foot show minimal dorsal spurring of the midfoot.  XR Foot Complete Right Result Date: 05/19/2023 X-rays of the right foot show minimal dorsal spurring of the midfoot.  XR Hand Complete Left Result Date: 05/19/2023 X-rays of the left hand show no acute or structural abnormalities  XR Hand Complete Right Result Date: 05/19/2023 X-rays of the right hand show no acute or structural abnormalities.    PMFS History: Patient Active Problem List   Diagnosis Date Noted   Venous insufficiency (chronic) (peripheral) 08/23/2021   Diabetes mellitus without complication (HCC) 09/20/2017   PCP NOTES >>>>> 11/20/2014   Annual physical exam 10/22/2013    Obesity (BMI 30-39.9) 10/17/2012   GERD (gastroesophageal reflux disease) 03/15/2012   Laryngopharyngeal reflux disease 03/15/2012   VITAMIN D  DEFICIENCY 02/21/2009   Hypogonadism in male 01/29/2008   HEMORRHOIDS 01/29/2008   Herpes simplex virus (HSV) infection 05/25/2007   OBSTRUCTIVE SLEEP APNEA 05/25/2007   HYPERCHOLESTEROLEMIA 05/24/2007   Anxiety state 05/24/2007   RESTLESS LEG SYNDROME 05/24/2007   Constipation 05/24/2007   Past Medical History:  Diagnosis Date   Anxiety    BELLS PALSY 05/25/2007   Qualifier: History of  By: Elinor Guardian MD, Raenelle Bumpers    Constipation    Headache(784.0)    Hemorrhoids    History of Bell's palsy    History of iron deficiency    HSV (herpes simplex virus) infection    Hypercholesteremia    Obesity    OSA (obstructive sleep apnea)    Restless leg syndrome    Testosterone  deficiency    Vitamin D  deficiency     Family History  Problem Relation Age of Onset   CAD Father        at a young age   Diabetes Mother        M , Broth   Stroke Other        GF (late onset)   Colon cancer Neg Hx    Prostate cancer Neg Hx     Past Surgical History:  Procedure Laterality Date   right inguinal hernia repair as a child     Social History   Occupational History   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

## 2023-05-20 LAB — RHEUMATOID FACTOR: Rheumatoid fact SerPl-aCnc: <10 [IU]/mL (ref <14)

## 2023-05-20 LAB — URIC ACID: Uric Acid, Serum: 5.8 mg/dL (ref 4.0–8.0)

## 2023-05-20 LAB — C-REACTIVE PROTEIN: CRP: <3 mg/L (ref <8.0)

## 2023-05-20 LAB — SEDIMENTATION RATE: Sed Rate: 9 mm/h (ref 0–20)

## 2023-06-14 IMAGING — US US EXTREM LOW VENOUS
1 series · 13 of 24 positions shown · non-contrast
Comparison: None.

CLINICAL DATA: Bilateral leg swelling



[Series 1: us extrem low venous · 13 of 63 slices shown]
[im 1/63]
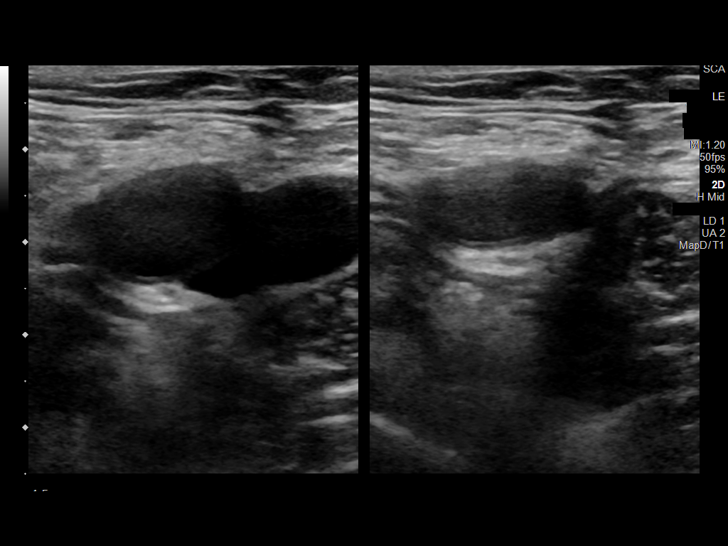
[im 6/63]
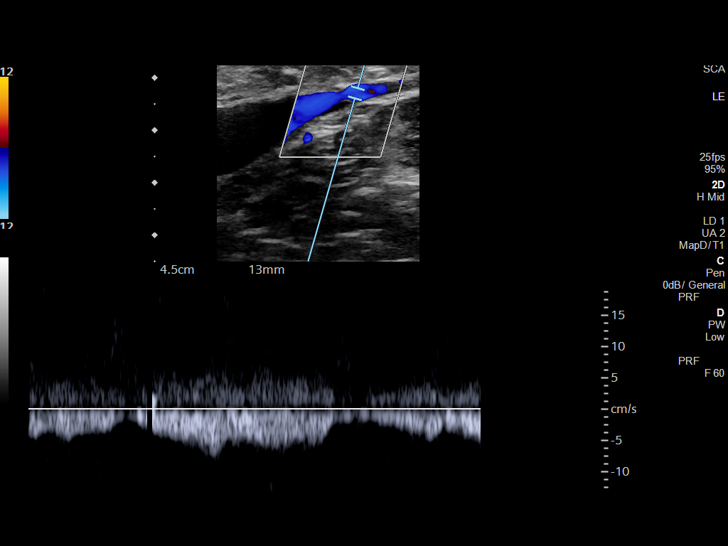
[im 11/63]
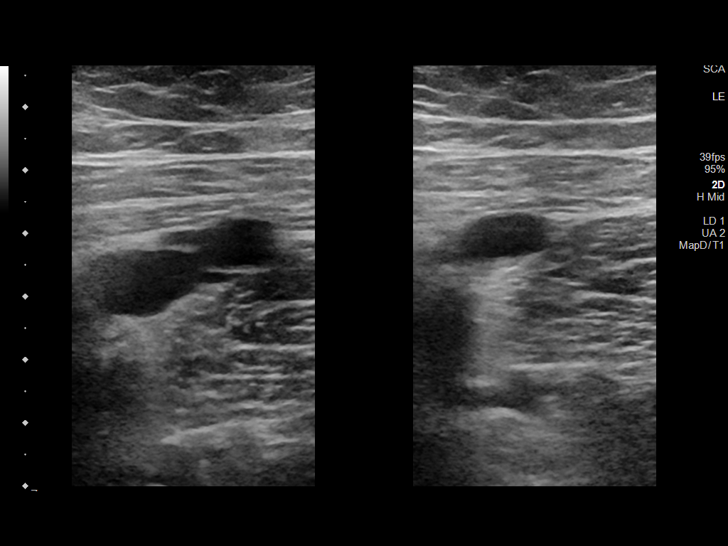
[im 17/63]
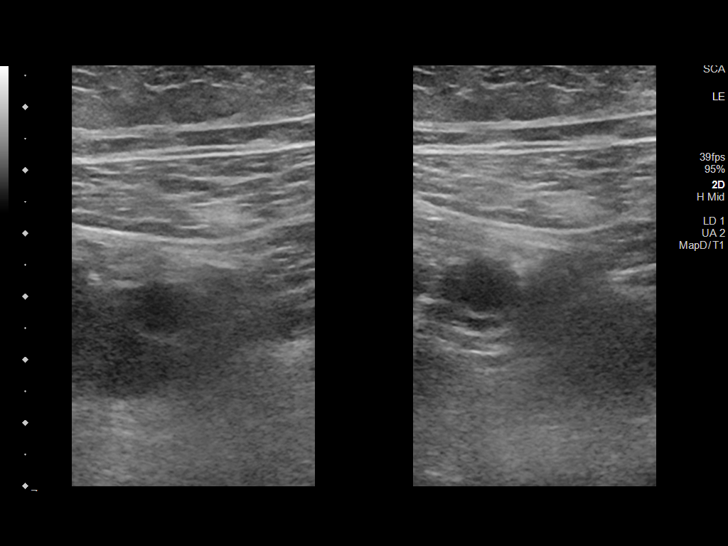
[im 22/63]
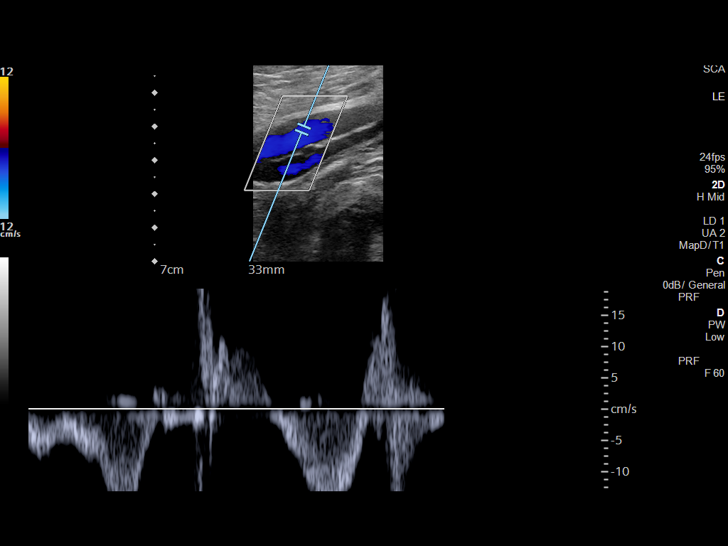
[im 27/63]
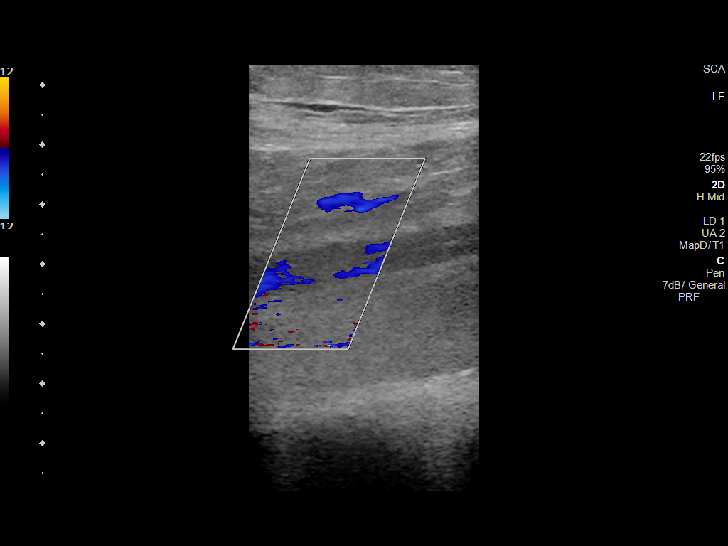
[im 33/63]
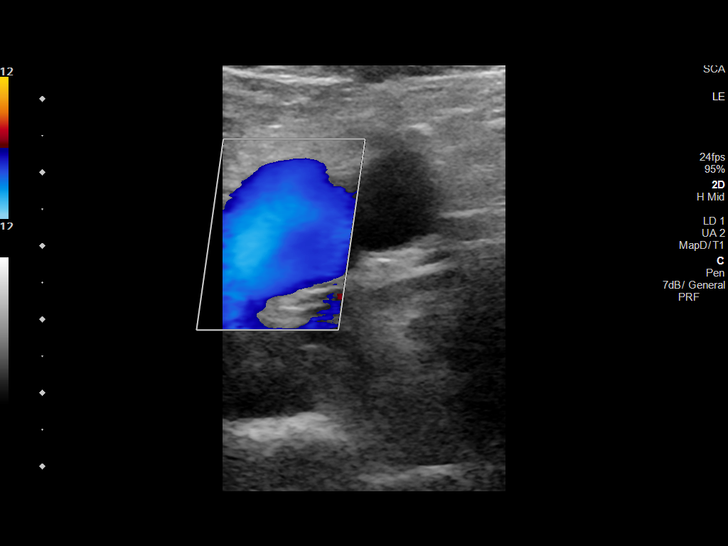
[im 36/63]
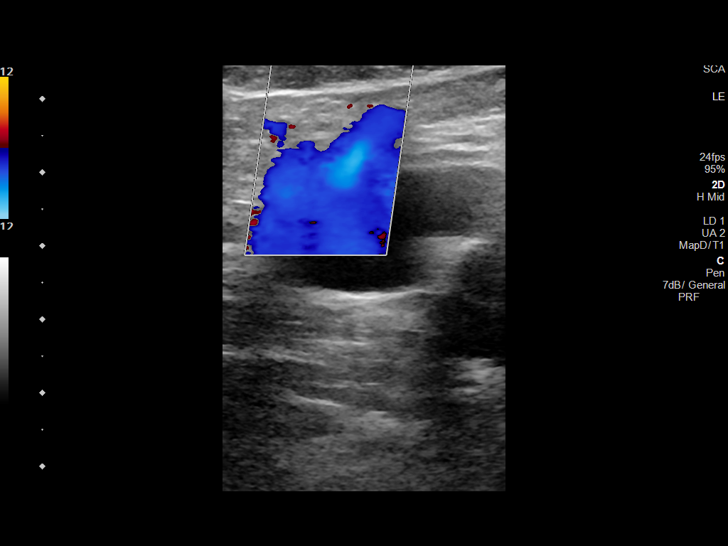
[im 41/63]
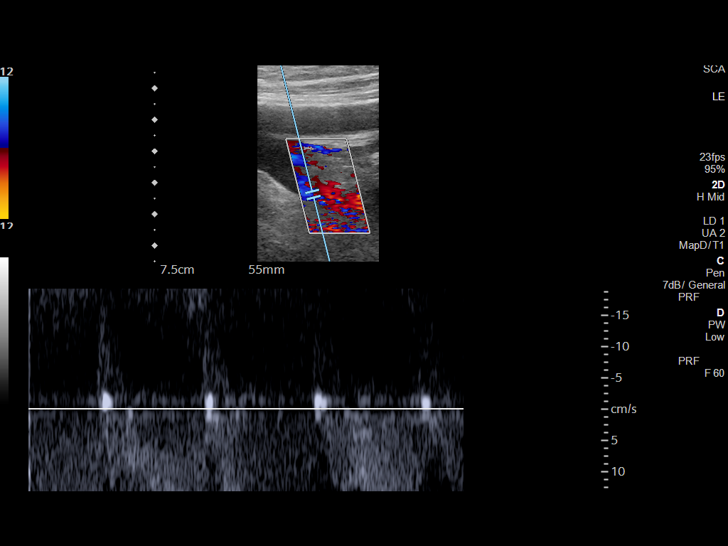
[im 46/63]
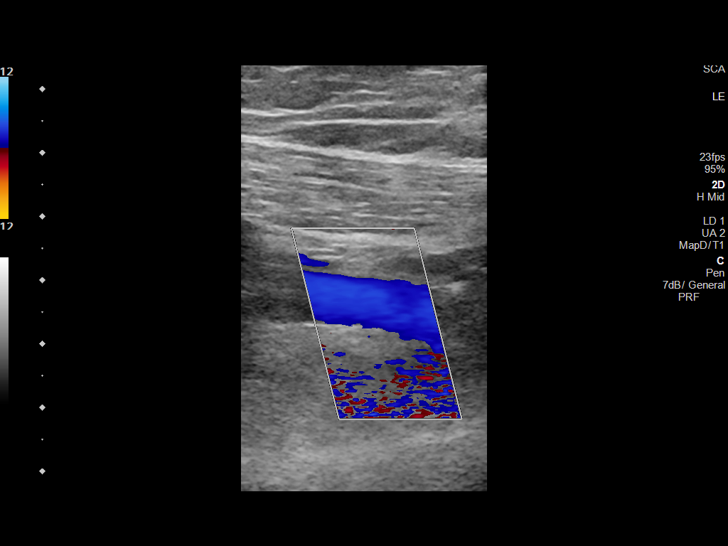
[im 52/63]
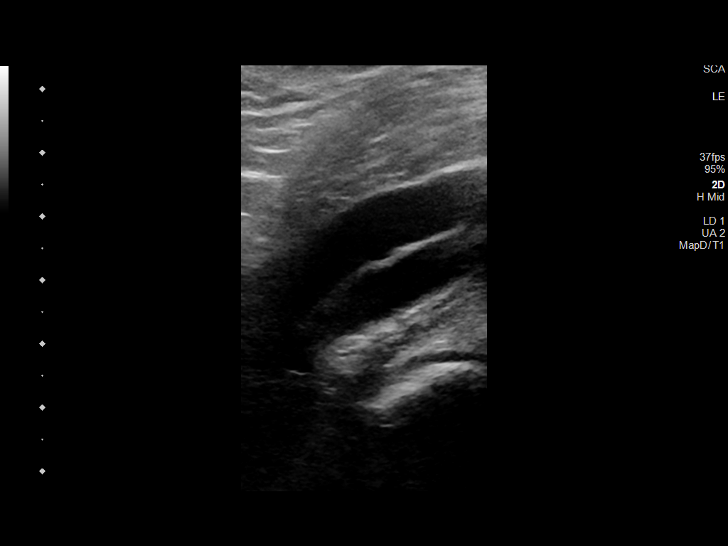
[im 57/63]
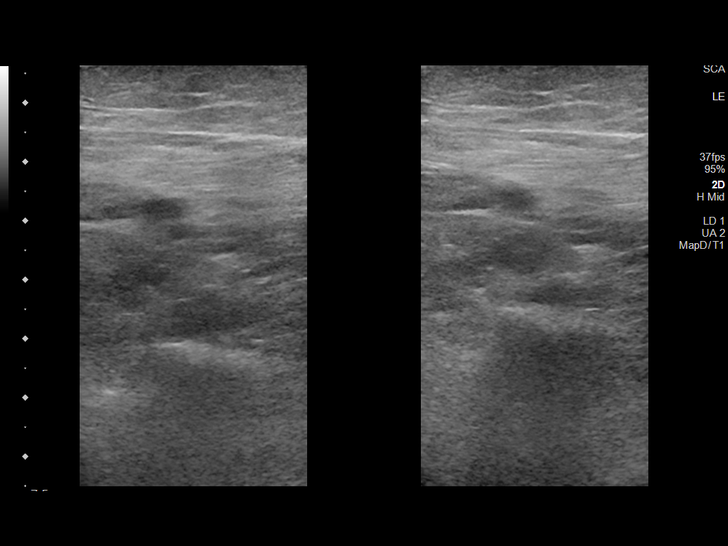
[im 63/63]
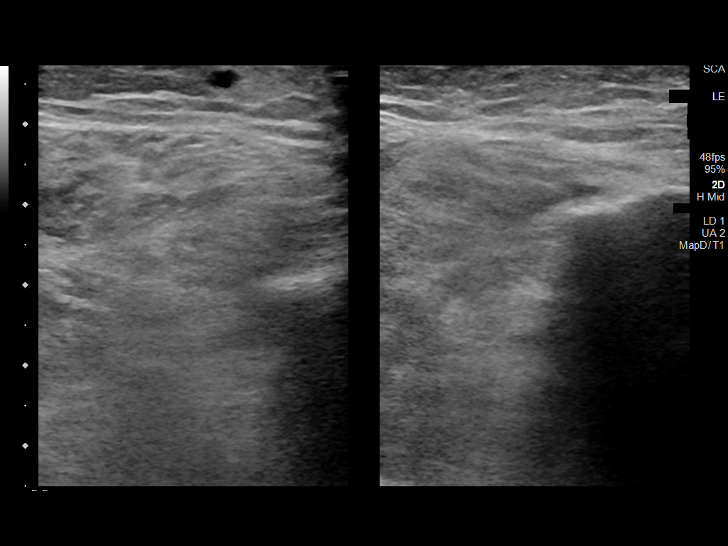

[13 of 24 positions shown; findings below may reference images not displayed]

FINDINGS: RIGHT LOWER EXTREMITY

Common Femoral Vein: No evidence of thrombus. Normal
compressibility, respiratory phasicity and response to augmentation.

Saphenofemoral Junction: No evidence of thrombus. Normal
compressibility and flow on color Doppler imaging.

Profunda Femoral Vein: No evidence of thrombus. Normal
compressibility and flow on color Doppler imaging.

Femoral Vein: No evidence of thrombus. Normal compressibility,
respiratory phasicity and response to augmentation.

Popliteal Vein: No evidence of thrombus. Normal compressibility,
respiratory phasicity and response to augmentation.

Calf Veins: No evidence of thrombus. Normal compressibility and flow
on color Doppler imaging.

Superficial Great Saphenous Vein: No evidence of thrombus. Normal
compressibility.

Venous Reflux:  None.

Other Findings:  None.

LEFT LOWER EXTREMITY

Common Femoral Vein: No evidence of thrombus. Normal
compressibility, respiratory phasicity and response to augmentation.

Saphenofemoral Junction: No evidence of thrombus. Normal
compressibility and flow on color Doppler imaging.

Profunda Femoral Vein: No evidence of thrombus. Normal
compressibility and flow on color Doppler imaging.

Femoral Vein: No evidence of thrombus. Normal compressibility,
respiratory phasicity and response to augmentation.

Popliteal Vein: No evidence of thrombus. Normal compressibility,
respiratory phasicity and response to augmentation.

Calf Veins: No evidence of thrombus. Normal compressibility and flow
on color Doppler imaging.

Superficial Great Saphenous Vein: No evidence of thrombus. Normal
compressibility.

Venous Reflux:  None.

Other Findings:  None.
IMPRESSION: No evidence of deep venous thrombosis in either lower extremity.

## 2023-06-27 ENCOUNTER — Telehealth: Payer: Self-pay

## 2023-06-27 NOTE — Telephone Encounter (Signed)
 Copied from CRM [id number]. Topic: General - Other >> Jun 27, 2023  1:33 PM Dorisann Garre T wrote: Reason for CRM: patient wife is needing a call back regarding his appt for April 8th she stated it has not been billed to the insurance company yet

## 2023-07-18 ENCOUNTER — Other Ambulatory Visit: Payer: Self-pay | Admitting: Internal Medicine

## 2023-08-02 ENCOUNTER — Encounter: Admitting: Internal Medicine

## 2023-08-19 ENCOUNTER — Other Ambulatory Visit: Payer: Self-pay | Admitting: Internal Medicine

## 2023-09-01 ENCOUNTER — Other Ambulatory Visit (HOSPITAL_COMMUNITY): Payer: Self-pay

## 2023-09-30 ENCOUNTER — Other Ambulatory Visit: Payer: Self-pay | Admitting: Internal Medicine

## 2023-09-30 NOTE — Telephone Encounter (Signed)
 Requesting: testosterone   Contract: n/a  UDS: n/a Last Visit:  05/03/23 Next Visit: None Last Refill: 04/18/23 #60 and 0RF   Please Advise

## 2023-10-29 ENCOUNTER — Other Ambulatory Visit: Payer: Self-pay | Admitting: Internal Medicine

## 2023-11-03 ENCOUNTER — Other Ambulatory Visit: Payer: Self-pay | Admitting: Family

## 2023-11-14 ENCOUNTER — Encounter: Payer: Self-pay | Admitting: Internal Medicine

## 2023-11-14 ENCOUNTER — Ambulatory Visit: Admitting: Internal Medicine

## 2023-11-14 VITALS — BP 136/84 | HR 64 | Temp 98.0°F | Resp 18 | Ht 71.0 in | Wt 240.5 lb

## 2023-11-14 DIAGNOSIS — R600 Localized edema: Secondary | ICD-10-CM

## 2023-11-14 DIAGNOSIS — Z23 Encounter for immunization: Secondary | ICD-10-CM

## 2023-11-14 DIAGNOSIS — R0683 Snoring: Secondary | ICD-10-CM | POA: Diagnosis not present

## 2023-11-14 DIAGNOSIS — Z0189 Encounter for other specified special examinations: Secondary | ICD-10-CM | POA: Diagnosis not present

## 2023-11-14 DIAGNOSIS — E119 Type 2 diabetes mellitus without complications: Secondary | ICD-10-CM | POA: Diagnosis not present

## 2023-11-14 DIAGNOSIS — G2581 Restless legs syndrome: Secondary | ICD-10-CM

## 2023-11-14 DIAGNOSIS — E7800 Pure hypercholesterolemia, unspecified: Secondary | ICD-10-CM

## 2023-11-14 DIAGNOSIS — E291 Testicular hypofunction: Secondary | ICD-10-CM

## 2023-11-14 MED ORDER — ATORVASTATIN CALCIUM 40 MG PO TABS: 40.0000 mg | ORAL_TABLET | Freq: Every day | ORAL | 1 refills | Status: DC

## 2023-11-14 MED ORDER — ACYCLOVIR 200 MG PO CAPS: 200.0000 mg | ORAL_CAPSULE | Freq: Two times a day (BID) | ORAL | 1 refills | Status: AC

## 2023-11-14 MED ORDER — ROPINIROLE HCL 0.25 MG PO TABS: 0.2500 mg | ORAL_TABLET | Freq: Three times a day (TID) | ORAL | 1 refills | Status: AC

## 2023-11-14 MED ORDER — SILDENAFIL CITRATE 20 MG PO TABS: ORAL_TABLET | ORAL | 3 refills | Status: AC

## 2023-11-14 MED ORDER — FUROSEMIDE 20 MG PO TABS: 20.0000 mg | ORAL_TABLET | Freq: Every day | ORAL | 1 refills | Status: AC

## 2023-11-14 MED ORDER — ACYCLOVIR 5 % EX OINT: TOPICAL_OINTMENT | CUTANEOUS | 2 refills | Status: AC

## 2023-11-14 MED ORDER — POTASSIUM CHLORIDE CRYS ER 10 MEQ PO TBCR: 10.0000 meq | EXTENDED_RELEASE_TABLET | Freq: Every day | ORAL | 1 refills | Status: AC

## 2023-11-14 NOTE — Progress Notes (Addendum)
 "  Subjective:    Patient ID: Gregory Santos, male    DOB: 1965/04/26, 58 y.o.   MRN: [id number]  DOS:  11/14/2023 Follow-up  Discussed the use of AI scribe software for clinical note transcription with the patient, who gave verbal consent to proceed.  History of Present Illness  Hypogonadism and testosterone  therapy - On testosterone  therapy for hypogonadism  Hyperlipidemia - Takes atorvastatin  40 mg daily  Sleep-disordered breathing - Experiences snoring - History of sleep apnea -  + Daytime tiredness and sleepiness   Medication and supplement use - Uncertain about use of Lasix  or potassium supplements   Immunization status - Received hepatitis vaccine - Uncertain about COVID vaccination status    Review of Systems See above   Past Medical History:  Diagnosis Date   Anxiety    BELLS PALSY 05/25/2007   Qualifier: History of  By: Christi MD, Glendia HERO    Constipation    Headache(784.0)    Hemorrhoids    History of Bell's palsy    History of iron deficiency    HSV (herpes simplex virus) infection    Hypercholesteremia    Obesity    OSA (obstructive sleep apnea)    Restless leg syndrome    Testosterone  deficiency    Vitamin D  deficiency     Past Surgical History:  Procedure Laterality Date   right inguinal hernia repair as a child      Current Outpatient Medications  Medication Instructions   acyclovir  (ZOVIRAX ) 200 mg, Oral, 2 times daily, Needs appt   acyclovir  ointment (ZOVIRAX ) 5 % APPLY TOPICALLY EVERY 3  HOURS AS DIRECTED   atorvastatin  (LIPITOR) 40 mg, Oral, Daily at bedtime   cholecalciferol (VITAMIN D ) 2,000 Units, Daily   clotrimazole -betamethasone  (LOTRISONE ) cream Topical, Daily PRN   furosemide  (LASIX ) 20 mg, Oral, Daily   gabapentin  (NEURONTIN ) 100-300 mg, Oral, At bedtime PRN   hydrocortisone  (ANUSOL -HC) 2.5 % rectal cream Rectal, 2 times daily PRN   potassium chloride  (KLOR-CON  M10) 10 MEQ tablet 10 mEq, Oral, Daily   rOPINIRole  (REQUIP )  0.25 mg, Oral, 3 times daily   sildenafil  (REVATIO ) 20 MG tablet TAKE THREE TO FOUR TABLETS BY MOUTH NIGHTLY AS NEEDED   Testosterone  40.5 MG/2.5GM (1.62%) GEL APPLY 2 PACKETS TO UPPER ARMS AFTER SHOWER EACH MORNING (EXCEPT ON MONDAYS AND THURSDAYS, ONLY APPLY 1 PACKET).       Objective:   Physical Exam BP 136/84   Pulse 64   Temp 98 F (36.7 C) (Oral)   Resp 18   Ht 5' 11 (1.803 m)   Wt 240 lb 8 oz (109.1 kg)   SpO2 95%   BMI 33.54 kg/m  General:   Well developed, NAD, BMI noted. HEENT:  Normocephalic . Face symmetric, atraumatic Lungs:  CTA B Normal respiratory effort, no intercostal retractions, no accessory muscle use. Heart: RRR,  no murmur.  Lower extremities: no pretibial edema bilaterally  Skin: Not pale. Not jaundice Neurologic:  alert & oriented X3.  Speech normal, gait appropriate for age and unassisted Psych--  Cognition and judgment appear intact.  Cooperative with normal attention span and concentration.  Behavior appropriate. No anxious or depressed appearing.      Assessment     Assessment  DM dx 07-2021 A1C 6.9 hyperlipidemia Anxiety OSA, RLS sleep study 2006 w/ RDI=17 & mod snoring w/ signif leg jerks; never tried CPAP, uses  Requip   prn; states snoring is gone w/ wt reduction  Obesity Low testosterone  (f/u pcp) -->  known hx atrophic testes likely from mumps orchitis, on HRT --> sx , improved subjectively Genital HSV -- acyclovir  qd , acyclovir  cream Vitamin D  deficiency 2010 Venous  insufficiency bilaterally (saw vascular 07/07/2021). Edema: Lasix /K+ as needed Dermatitis: Uses as needed Lotrisone -needs to at the groins +FH CAD father  H/o Recurrent cough H/o hemorrhoids  H/o Bell's palsy Nail dystrophy: penlac  ,creams prn    Assessment & Plan DM: Currently diet controlled, check A1c and micro High cholesterol: Managed with atorvastatin  40 mg daily.  Last LFTs normal, check a FLP  Hypogonadism: Ongoing testosterone  replacement  therapy.  Check testosterone  level and a PSA.  Last CBC normal. Obstructive sleep apnea, RLS Symptoms of snoring and daytime fatigue. Previous neurology referrals unsuccessful, open to reconsidering CPAP therapy.  Plan: Refer to neurology for evaluation and management Refill request Lower extremity edema: He is not certain if he is taking Lasix  or potassium ED: Refill sildenafil  Preventive care: Flu shot today, recommend a COVID-vaccine available at the pharmacy RTC 5 months CPX     "

## 2023-11-14 NOTE — Patient Instructions (Addendum)
 Go to the front desk for the checkout Please make an appointment for a physical exam in 4 to 5 months      Please read more detailed instructions below    VISIT SUMMARY: You had a follow-up visit to manage your chronic conditions. Your blood pressure is well-controlled, and there are no new concerns. We discussed your current medications, immunization status, and ongoing symptoms related to sleep apnea.  YOUR PLAN: ADULT WELLNESS VISIT: Routine visit with well-controlled blood pressure and no new concerns. -Administer flu shot. -Recommend COVID vaccine, available at pharmacy. -Schedule follow-up in 5 months for physical exam.  TYPE 2 DIABETES MELLITUS: High blood sugar levels that require ongoing monitoring. -Check blood sugar level today.  PURE HYPERCHOLESTEROLEMIA: High cholesterol managed with atorvastatin  40 mg daily. -Check cholesterol levels today.  TESTICULAR HYPOFUNCTION (HYPOGONADISM): Low testosterone  levels managed with ongoing testosterone  replacement therapy. -Check testosterone  level. -Check PSA level.  OBSTRUCTIVE SLEEP APNEA: Symptoms of snoring and daytime fatigue with previous unsuccessful CPAP therapy. -Refer to neurology for evaluation and management of sleep apnea.

## 2023-11-15 DIAGNOSIS — R600 Localized edema: Secondary | ICD-10-CM | POA: Insufficient documentation

## 2023-11-15 LAB — TESTOSTERONE: Testosterone: 326.09 ng/dL (ref 300.00–890.00)

## 2023-11-15 LAB — BASIC METABOLIC PANEL WITH GFR
BUN: 11 mg/dL (ref 6–23)
CO2: 27 meq/L (ref 19–32)
Calcium: 9.4 mg/dL (ref 8.4–10.5)
Chloride: 100 meq/L (ref 96–112)
Creatinine, Ser: 0.9 mg/dL (ref 0.40–1.50)
GFR: 95.39 mL/min (ref >60.00)
Glucose, Bld: 93 mg/dL (ref 70–99)
Potassium: 4.4 meq/L (ref 3.5–5.1)
Sodium: 136 meq/L (ref 135–145)

## 2023-11-15 LAB — LIPID PANEL
Cholesterol: 139 mg/dL (ref 0–200)
HDL: 38.6 mg/dL — ABNORMAL LOW (ref >39.00)
LDL Cholesterol: 91 mg/dL (ref 0–99)
NonHDL: 100.08
Total CHOL/HDL Ratio: 4
Triglycerides: 47 mg/dL (ref 0.0–149.0)
VLDL: 9.4 mg/dL (ref 0.0–40.0)

## 2023-11-15 LAB — MICROALBUMIN / CREATININE URINE RATIO
Creatinine,U: 162.2 mg/dL
Microalb Creat Ratio: 23.2 mg/g (ref 0.0–30.0)
Microalb, Ur: 3.8 mg/dL — ABNORMAL HIGH (ref 0.0–1.9)

## 2023-11-15 LAB — PSA: PSA: 0.51 ng/mL (ref 0.10–4.00)

## 2023-11-15 LAB — HEMOGLOBIN A1C: Hgb A1c MFr Bld: 6.4 % (ref 4.6–6.5)

## 2023-11-15 NOTE — Assessment & Plan Note (Signed)
 DM: Currently diet controlled, check A1c and micro High cholesterol: Managed with atorvastatin  40 mg daily.  Last LFTs normal, check a FLP  Hypogonadism: Ongoing testosterone  replacement therapy.  Check testosterone  level and a PSA.  Last CBC normal. Obstructive sleep apnea, RLS Symptoms of snoring and daytime fatigue. Previous neurology referrals unsuccessful, open to reconsidering CPAP therapy.  Plan: Refer to neurology for evaluation and management Refill request Lower extremity edema: He is not certain if he is taking Lasix  or potassium ED: Refill sildenafil  Preventive care: Flu shot today, recommend a COVID-vaccine available at the pharmacy RTC 5 months CPX

## 2023-11-17 ENCOUNTER — Ambulatory Visit: Payer: Self-pay | Admitting: Internal Medicine

## 2023-12-28 ENCOUNTER — Telehealth: Payer: Self-pay | Admitting: Internal Medicine

## 2023-12-28 ENCOUNTER — Other Ambulatory Visit: Payer: Self-pay | Admitting: Internal Medicine

## 2023-12-28 MED ORDER — ATORVASTATIN CALCIUM 40 MG PO TABS: 40.0000 mg | ORAL_TABLET | Freq: Every day | ORAL | 1 refills | Status: AC

## 2023-12-28 NOTE — Telephone Encounter (Unsigned)
 Copied from CRM #[id number]. Topic: Clinical - Medication Refill >> Dec 28, 2023  2:43 PM Shereese L wrote: Medication: atorvastatin  (LIPITOR) 40 MG tablet  Has the patient contacted their pharmacy? Yes (Agent: If no, request that the patient contact the pharmacy for the refill. If patient does not wish to contact the pharmacy document the reason why and proceed with request.) (Agent: If yes, when and what did the pharmacy advise?)  This is the patient's preferred pharmacy:  [id number] [place], [place] - [address] [place] [address] Phone: [phone] Fax: [phone]  Is this the correct pharmacy for this prescription? Yes If no, delete pharmacy and type the correct one.   Has the prescription been filled recently? Yes  Is the patient out of the medication? Yes  Has the patient been seen for an appointment in the last year OR does the patient have an upcoming appointment? Yes  Can we respond through MyChart? Yes  Agent: Please be advised that Rx refills may take up to 3 business days. We ask that you follow-up with your pharmacy.

## 2023-12-28 NOTE — Telephone Encounter (Unsigned)
 Copied from CRM [id number]. Topic: Clinical - Medication Refill >> Dec 28, 2023  2:47 PM Shereese L wrote: Medication: atorvastatin  (LIPITOR) 40 MG tablet  Has the patient contacted their pharmacy? Yes (Agent: If no, request that the patient contact the pharmacy for the refill. If patient does not wish to contact the pharmacy document the reason why and proceed with request.) (Agent: If yes, when and what did the pharmacy advise?)  This is the patient's preferred pharmacy:  [id number] [place], [place] - [address] [place] [address] Phone: [phone] Fax: [phone]  Is this the correct pharmacy for this prescription? Yes If no, delete pharmacy and type the correct one.   Has the prescription been filled recently? Yes  Is the patient out of the medication? Yes  Has the patient been seen for an appointment in the last year OR does the patient have an upcoming appointment? Yes  Can we respond through MyChart? Yes  Agent: Please be advised that Rx refills may take up to 3 business days. We ask that you follow-up with your pharmacy.

## 2023-12-28 NOTE — Telephone Encounter (Unsigned)
 Copied from CRM [id number]. Topic: Clinical - Medication Refill >> Dec 28, 2023  2:29 PM Frederich PARAS wrote: Medication:  Testosterone  40.5 MG/2.5GM (1.62%) GEL, hydrocortisone  (ANUSOL -HC) 2.5 % rectal cream   Has the patient contacted their pharmacy? Yes They advise pt reach out to provider  This is the patient's preferred pharmacy:  [id number] [place], [place] - [address] [place] [address] Phone: [phone] Fax: [phone]  Is this the correct pharmacy for this prescription? Yes If no, delete pharmacy and type the correct one.   Has the prescription been filled recently? Yes  Is the patient out of the medication? Yes  Has the patient been seen for an appointment in the last year OR does the patient have an upcoming appointment? Yes  Can we respond through MyChart? No  Agent: Please be advised that Rx refills may take up to 3 business days. We ask that you follow-up with your pharmacy.

## 2023-12-28 NOTE — Telephone Encounter (Signed)
 Copied from CRM #[id number]. Topic: Clinical - Medication Refill >> Dec 28, 2023  2:43 PM Shereese L wrote: Medication: atorvastatin  (LIPITOR) 40 MG tablet  Has the patient contacted their pharmacy? Yes (Agent: If no, request that the patient contact the pharmacy for the refill. If patient does not wish to contact the pharmacy document the reason why and proceed with request.) (Agent: If yes, when and what did the pharmacy advise?)  This is the patient's preferred pharmacy:  [id number] [place], [place] - [address] [place] [address] Phone: [phone] Fax: [phone]  Is this the correct pharmacy for this prescription? Yes If no, delete pharmacy and type the correct one.   Has the prescription been filled recently? Yes  Is the patient out of the medication? Yes  Has the patient been seen for an appointment in the last year OR does the patient have an upcoming appointment? Yes  Can we respond through MyChart? Yes  Agent: Please be advised that Rx refills may take up to 3 business days. We ask that you follow-up with your pharmacy.

## 2023-12-29 ENCOUNTER — Telehealth: Payer: Self-pay

## 2023-12-29 ENCOUNTER — Other Ambulatory Visit (HOSPITAL_COMMUNITY): Payer: Self-pay

## 2023-12-29 MED ORDER — TESTOSTERONE 40.5 MG/2.5GM (1.62%) TD GEL: 2.0000 | Freq: Every morning | TRANSDERMAL | 0 refills | Status: AC

## 2023-12-29 MED ORDER — HYDROCORTISONE (PERIANAL) 2.5 % EX CREA: TOPICAL_CREAM | Freq: Two times a day (BID) | CUTANEOUS | 0 refills | Status: AC | PRN

## 2023-12-29 NOTE — Telephone Encounter (Signed)
 atorvastatin  (LIPITOR) 40 MG tablet 90 tablet 1 12/28/2023 --   Sig - Route: Take 1 tablet (40 mg total) by mouth at bedtime. - Oral   Sent to pharmacy as: atorvastatin  (LIPITOR) 40 MG tablet   E-Prescribing Status: Receipt confirmed by pharmacy (12/28/2023  4:37 PM EST)     Error CRM sent. Already refilled on 12/28/23

## 2023-12-29 NOTE — Telephone Encounter (Signed)
 Requesting: testosterone   Contract: n/a UDS: n/a Last Visit:11/14/23 Next Visit: 04/17/24 Last Refill: 09/30/23 #150 and 0RF   Please Advise

## 2023-12-29 NOTE — Telephone Encounter (Signed)
 Pharmacy Patient Advocate Encounter   Received notification from CoverMyMeds that prior authorization for Acyclovir  5% ointment is required/requested.   Insurance verification completed.   The patient is insured through [employer].   Per test claim: PA required; PA started via CoverMyMeds. KEY [id number] . Waiting for clinical questions to populate.

## 2023-12-30 ENCOUNTER — Other Ambulatory Visit (HOSPITAL_COMMUNITY): Payer: Self-pay

## 2023-12-30 NOTE — Telephone Encounter (Signed)
 Pharmacy Patient Advocate Encounter  Received notification from [employer] that Prior Authorization for Acyclovir  5% ointment has been APPROVED from 11/30/23 to 12/29/24   PA #/Case ID/Reference #: [id number]

## 2024-04-17 ENCOUNTER — Encounter: Admitting: Internal Medicine
